# Patient Record
Sex: Female | Born: 1995 | Hispanic: Yes | Marital: Single | Smoking: Never smoker
Health system: Southern US, Community
[De-identification: ages and names within clinical notes are randomized; demographics above are authoritative.]

## PROBLEM LIST (undated history)

## (undated) HISTORY — PX: HERNIA REPAIR: SHX51

---
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
## 2017-11-30 ENCOUNTER — Emergency Department: Payer: 59 | Admitting: Anesthesiology

## 2017-11-30 ENCOUNTER — Emergency Department: Payer: 59

## 2017-11-30 ENCOUNTER — Other Ambulatory Visit: Payer: Self-pay

## 2017-11-30 ENCOUNTER — Ambulatory Visit
Admission: EM | Admit: 2017-11-30 | Discharge: 2017-12-01 | Disposition: A | Discharge status: Home / Self Care | Payer: 59 | Attending: Obstetrics and Gynecology | Admitting: Obstetrics and Gynecology

## 2017-11-30 ENCOUNTER — Encounter
Admission: EM | Disposition: A | Discharge status: Home / Self Care | Payer: Self-pay | Source: Home / Self Care | Attending: Emergency Medicine

## 2017-11-30 DIAGNOSIS — D271 Benign neoplasm of left ovary: Secondary | ICD-10-CM | POA: Insufficient documentation

## 2017-11-30 DIAGNOSIS — R102 Pelvic and perineal pain: Secondary | ICD-10-CM | POA: Diagnosis present

## 2017-11-30 DIAGNOSIS — N83202 Unspecified ovarian cyst, left side: Secondary | ICD-10-CM | POA: Diagnosis present

## 2017-11-30 DIAGNOSIS — N838 Other noninflammatory disorders of ovary, fallopian tube and broad ligament: Secondary | ICD-10-CM

## 2017-11-30 HISTORY — PX: LAPAROSCOPY: SHX197

## 2017-11-30 LAB — URINALYSIS, COMPLETE (UACMP) WITH MICROSCOPIC
BILIRUBIN URINE: NEGATIVE
Glucose, UA: NEGATIVE mg/dL
Hgb urine dipstick: NEGATIVE
Ketones, ur: 5 mg/dL — AB
Nitrite: NEGATIVE
PROTEIN: NEGATIVE mg/dL
SPECIFIC GRAVITY, URINE: 1.018 (ref 1.005–1.030)
pH: 7 (ref 5.0–8.0)

## 2017-11-30 LAB — COMPREHENSIVE METABOLIC PANEL
ALBUMIN: 4.2 g/dL (ref 3.5–5.0)
ALT: 7 U/L (ref 0–44)
AST: 14 U/L — AB (ref 15–41)
Alkaline Phosphatase: 38 U/L (ref 38–126)
Anion gap: 8 (ref 5–15)
BUN: 11 mg/dL (ref 6–20)
CHLORIDE: 106 mmol/L (ref 98–111)
CO2: 22 mmol/L (ref 22–32)
CREATININE: 0.55 mg/dL (ref 0.44–1.00)
Calcium: 9 mg/dL (ref 8.9–10.3)
GFR calc Af Amer: >60 mL/min (ref >60)
GLUCOSE: 113 mg/dL — AB (ref 70–99)
Potassium: 3.6 mmol/L (ref 3.5–5.1)
SODIUM: 136 mmol/L (ref 135–145)
Total Bilirubin: 0.7 mg/dL (ref 0.3–1.2)
Total Protein: 7.7 g/dL (ref 6.5–8.1)

## 2017-11-30 LAB — WET PREP, GENITAL
Clue Cells Wet Prep HPF POC: NONE SEEN
SPERM: NONE SEEN
Trich, Wet Prep: NONE SEEN
YEAST WET PREP: NONE SEEN

## 2017-11-30 LAB — CBC
HEMATOCRIT: 33.6 % — AB (ref 36.0–46.0)
Hemoglobin: 10.8 g/dL — ABNORMAL LOW (ref 12.0–15.0)
MCH: 28.1 pg (ref 26.0–34.0)
MCHC: 32.1 g/dL (ref 30.0–36.0)
MCV: 87.3 fL (ref 80.0–100.0)
Platelets: 408 10*3/uL — ABNORMAL HIGH (ref 150–400)
RBC: 3.85 MIL/uL — ABNORMAL LOW (ref 3.87–5.11)
RDW: 13 % (ref 11.5–15.5)
WBC: 13 10*3/uL — ABNORMAL HIGH (ref 4.0–10.5)
nRBC: 0 % (ref 0.0–0.2)

## 2017-11-30 LAB — LIPASE, BLOOD: LIPASE: 27 U/L (ref 11–51)

## 2017-11-30 LAB — CHLAMYDIA/NGC RT PCR (ARMC ONLY)
Chlamydia Tr: NOT DETECTED
N GONORRHOEAE: NOT DETECTED

## 2017-11-30 SURGERY — LAPAROSCOPY, DIAGNOSTIC
Anesthesia: General | Site: Abdomen | Laterality: Right

## 2017-11-30 MED ORDER — MIDAZOLAM HCL 2 MG/2ML IJ SOLN
INTRAMUSCULAR | Status: DC | PRN
  Administered 2017-11-30: 2 mg via INTRAVENOUS

## 2017-11-30 MED ORDER — OXYCODONE-ACETAMINOPHEN 5-325 MG PO TABS
1.0000 | ORAL_TABLET | ORAL | Status: AC | PRN
  Administered 2017-11-30 (×2): 1 via ORAL
  Filled 2017-11-30 (×2): qty 1

## 2017-11-30 MED ORDER — FENTANYL CITRATE (PF) 100 MCG/2ML IJ SOLN
INTRAMUSCULAR | Status: AC
  Filled 2017-11-30: qty 2

## 2017-11-30 MED ORDER — ROCURONIUM BROMIDE 50 MG/5ML IV SOLN
INTRAVENOUS | Status: AC
  Filled 2017-11-30: qty 1

## 2017-11-30 MED ORDER — HYDROMORPHONE HCL 1 MG/ML IJ SOLN
0.5000 mg | Freq: Once | INTRAMUSCULAR | Status: AC
  Administered 2017-11-30: 0.5 mg via INTRAMUSCULAR
  Filled 2017-11-30: qty 1

## 2017-11-30 MED ORDER — SUCCINYLCHOLINE CHLORIDE 20 MG/ML IJ SOLN
INTRAMUSCULAR | Status: DC | PRN
  Administered 2017-11-30: 80 mg via INTRAVENOUS

## 2017-11-30 MED ORDER — ROCURONIUM BROMIDE 100 MG/10ML IV SOLN
INTRAVENOUS | Status: DC | PRN
  Administered 2017-11-30: 30 mg via INTRAVENOUS
  Administered 2017-12-01: 10 mg via INTRAVENOUS

## 2017-11-30 MED ORDER — MIDAZOLAM HCL 2 MG/2ML IJ SOLN
INTRAMUSCULAR | Status: AC
  Filled 2017-11-30: qty 2

## 2017-11-30 MED ORDER — HYDROMORPHONE HCL 1 MG/ML IJ SOLN
0.5000 mg | Freq: Once | INTRAMUSCULAR | Status: AC
  Administered 2017-11-30: 0.5 mg via INTRAVENOUS
  Filled 2017-11-30: qty 1

## 2017-11-30 MED ORDER — PROPOFOL 10 MG/ML IV BOLUS
INTRAVENOUS | Status: DC | PRN
  Administered 2017-11-30: 150 mg via INTRAVENOUS

## 2017-11-30 MED ORDER — BUPIVACAINE HCL (PF) 0.5 % IJ SOLN
INTRAMUSCULAR | Status: AC
  Filled 2017-11-30: qty 30

## 2017-11-30 MED ORDER — ACETAMINOPHEN 10 MG/ML IV SOLN
INTRAVENOUS | Status: AC
  Filled 2017-11-30: qty 100

## 2017-11-30 MED ORDER — FENTANYL CITRATE (PF) 100 MCG/2ML IJ SOLN
INTRAMUSCULAR | Status: DC | PRN
  Administered 2017-11-30 – 2017-12-01 (×4): 50 ug via INTRAVENOUS

## 2017-11-30 MED ORDER — LACTATED RINGERS IV SOLN: INTRAVENOUS | Status: DC

## 2017-11-30 MED ORDER — LIDOCAINE HCL (CARDIAC) PF 100 MG/5ML IV SOSY
PREFILLED_SYRINGE | INTRAVENOUS | Status: DC | PRN
  Administered 2017-11-30: 60 mg via INTRAVENOUS

## 2017-11-30 MED ORDER — LACTATED RINGERS IV SOLN
INTRAVENOUS | Status: DC | PRN
  Administered 2017-11-30: 23:00:00 via INTRAVENOUS

## 2017-11-30 MED ORDER — PROPOFOL 10 MG/ML IV BOLUS
INTRAVENOUS | Status: AC
  Filled 2017-11-30: qty 20

## 2017-11-30 MED ORDER — DEXAMETHASONE SODIUM PHOSPHATE 10 MG/ML IJ SOLN
INTRAMUSCULAR | Status: DC | PRN
  Administered 2017-11-30: 10 mg via INTRAVENOUS

## 2017-11-30 SURGICAL SUPPLY — 39 items
ANCHOR TIS RET SYS 235ML (MISCELLANEOUS) ×3 IMPLANT
BAG URINE DRAINAGE (UROLOGICAL SUPPLIES) ×3 IMPLANT
BLADE SURG SZ11 CARB STEEL (BLADE) ×3 IMPLANT
CATH FOLEY 2WAY  5CC 16FR (CATHETERS) ×2
CATH URTH 16FR FL 2W BLN LF (CATHETERS) ×1 IMPLANT
CHLORAPREP W/TINT 26ML (MISCELLANEOUS) ×3 IMPLANT
CLOSURE WOUND 1/4X4 (GAUZE/BANDAGES/DRESSINGS) ×1
CORD MONOPOLAR M/FML 12FT (MISCELLANEOUS) ×3 IMPLANT
COVER WAND RF STERILE (DRAPES) IMPLANT
DERMABOND ADVANCED (GAUZE/BANDAGES/DRESSINGS) ×2
DERMABOND ADVANCED .7 DNX12 (GAUZE/BANDAGES/DRESSINGS) ×1 IMPLANT
GLOVE BIO SURGEON STRL SZ7 (GLOVE) ×9 IMPLANT
GLOVE INDICATOR 7.5 STRL GRN (GLOVE) ×6 IMPLANT
GOWN STRL REUS W/ TWL LRG LVL3 (GOWN DISPOSABLE) ×2 IMPLANT
GOWN STRL REUS W/TWL LRG LVL3 (GOWN DISPOSABLE) ×4
IRRIGATION STRYKERFLOW (MISCELLANEOUS) ×1 IMPLANT
IRRIGATOR STRYKERFLOW (MISCELLANEOUS) ×3
IV NS 1000ML (IV SOLUTION) ×2
IV NS 1000ML BAXH (IV SOLUTION) ×1 IMPLANT
KIT PINK PAD W/HEAD ARE REST (MISCELLANEOUS) ×3
KIT PINK PAD W/HEAD ARM REST (MISCELLANEOUS) ×1 IMPLANT
KIT TURNOVER CYSTO (KITS) ×3 IMPLANT
LABEL OR SOLS (LABEL) ×3 IMPLANT
LIGASURE LAP MARYLAND 5MM 37CM (ELECTROSURGICAL) ×3 IMPLANT
NS IRRIG 500ML POUR BTL (IV SOLUTION) ×3 IMPLANT
PACK GYN LAPAROSCOPIC (MISCELLANEOUS) ×3 IMPLANT
PAD OB MATERNITY 4.3X12.25 (PERSONAL CARE ITEMS) ×3 IMPLANT
PAD PREP 24X41 OB/GYN DISP (PERSONAL CARE ITEMS) ×3 IMPLANT
POUCH SPECIMEN RETRIEVAL 10MM (ENDOMECHANICALS) IMPLANT
SCISSORS METZENBAUM CVD 33 (INSTRUMENTS) IMPLANT
SLEEVE ENDOPATH XCEL 5M (ENDOMECHANICALS) ×3 IMPLANT
STRIP CLOSURE SKIN 1/4X4 (GAUZE/BANDAGES/DRESSINGS) ×2 IMPLANT
SUT MNCRL AB 4-0 PS2 18 (SUTURE) ×3 IMPLANT
SUT VIC AB 2-0 UR6 27 (SUTURE) ×3 IMPLANT
SUT VIC AB 4-0 SH 27 (SUTURE) ×2
SUT VIC AB 4-0 SH 27XANBCTRL (SUTURE) ×1 IMPLANT
TROCAR ENDO BLADELESS 11MM (ENDOMECHANICALS) ×3 IMPLANT
TROCAR XCEL NON-BLD 5MMX100MML (ENDOMECHANICALS) ×3 IMPLANT
TUBING INSUFFLATION (TUBING) ×3 IMPLANT

## 2017-11-30 NOTE — H&P (Signed)
Consult History and Physical   SERVICE: Gynecology   Patient Name: Theresa Christian Patient MRN:   [id number]  CC: RLQ pain  HPI: Theresa Christian is a 22 y.o. G0 with severe RLQ pain, started at noon today, wraps to lower back. CT found 6cm dermoid cyst on LEFT ovary. Similar sx and presentation 8 months ago.  On OCPs. Periods heavy, no dysmenorrhea, regular. No fhx of gyn cancers.  Meds: OCPs, Prozac Surgical hx: bilateral inguinal hernia repair at age 76     Review of Systems: positives in bold GEN:   fevers, chills, weight changes, appetite changes, fatigue, night sweats HEENT:  HA, vision changes, hearing loss, congestion, rhinorrhea, sinus pressure, dysphagia CV:   CP, palpitations PULM:  SOB, cough GI:  abd pain, N/V/D/C GU:  dysuria, urgency, frequency MSK:  arthralgias, myalgias, back pain, swelling SKIN:  rashes, color changes, pallor NEURO:  numbness, weakness, tingling, seizures, dizziness, tremors PSYCH:  depression, anxiety, behavioral problems, confusion  HEME/LYMPH:  easy bruising or bleeding ENDO:  heat/cold intolerance  Past Obstetrical History: OB History   None     Past Gynecologic History: Patient's last menstrual period was 11/22/2017 (approximate). M Past Medical History: History reviewed. No pertinent past medical history.  Past Surgical History:   Past Surgical History:  Procedure Laterality Date  . HERNIA REPAIR      Family History:  family history is not on file.  Social History:  Social History   Socioeconomic History  . Marital status: Single    Spouse name: Not on file  . Number of children: Not on file  . Years of education: Not on file  . Highest education level: Not on file  Occupational History  . Not on file  Social Needs  . Financial resource strain: Not on file  . Food insecurity:    Worry: Not on file    Inability: Not on file  . Transportation needs:    Medical: Not on file    Non-medical: Not on file  Tobacco  Use  . Smoking status: Never Smoker  . Smokeless tobacco: Never Used  Substance and Sexual Activity  . Alcohol use: Yes  . Drug use: Not Currently  . Sexual activity: Not on file  Lifestyle  . Physical activity:    Days per week: Not on file    Minutes per session: Not on file  . Stress: Not on file  Relationships  . Social connections:    Talks on phone: Not on file    Gets together: Not on file    Attends religious service: Not on file    Active member of club or organization: Not on file    Attends meetings of clubs or organizations: Not on file    Relationship status: Not on file  . Intimate partner violence:    Fear of current or ex partner: Not on file    Emotionally abused: Not on file    Physically abused: Not on file    Forced sexual activity: Not on file  Other Topics Concern  . Not on file  Social History Narrative  . Not on file    Home Medications:  Medications reconciled in EPIC  No current facility-administered medications on file prior to encounter.    Current Outpatient Medications on File Prior to Encounter  Medication Sig Dispense Refill  . FLUoxetine (PROZAC) 20 MG capsule Take 20 mg by mouth daily.    Marland Kitchen loratadine (CLARITIN) 10 MG tablet Take 10 mg by mouth  at bedtime.    . norethindrone-ethinyl estradiol-iron (MICROGESTIN FE,GILDESS FE,LOESTRIN FE) 1.5-30 MG-MCG tablet Take 1 tablet by mouth at bedtime.      Allergies:  No Known Allergies  Physical Exam:  Temp:  [98.3 F (36.8 C)] 98.3 F (36.8 C) (11/08 1428) Pulse Rate:  [89-91] 89 (11/08 1936) Resp:  [17-18] 18 (11/08 1936) BP: (136-139)/(86-90) 139/90 (11/08 1936) SpO2:  [100 %] 100 % (11/08 1936) Weight:  [77.1 kg] 77.1 kg (11/08 1429)   General Appearance:  Well developed, well nourished, no acute distress, alert and oriented x3 HEENT:  Normocephalic atraumatic, extraocular movements intact, moist mucous membranes Cardiovascular:  Normal S1/S2, regular rate and rhythm, no  murmurs Pulmonary:  clear to auscultation, no wheezes, rales or rhonchi, symmetric air entry, good air exchange Abdomen:  Bowel sounds present, soft, diffusely tender, nondistended, no abnormal masses, +guarding, limited exam Extremities:  Full range of motion, no pedal edema, 2+ distal pulses, no tenderness Skin:  normal coloration and turgor, no rashes, no suspicious skin lesions noted  Neurologic:  Cranial nerves 2-12 grossly intact, Psychiatric:  Normal mood and affect, appropriate, no AH/VH Pelvic:  deferred   Labs/Studies:   CBC and Coags:  Lab Results  Component Value Date   WBC 13.0 (H) 11/30/2017   HGB 10.8 (L) 11/30/2017   HCT 33.6 (L) 11/30/2017   MCV 87.3 11/30/2017   PLT 408 (H) 11/30/2017   CMP:  Lab Results  Component Value Date   NA 136 11/30/2017   K 3.6 11/30/2017   CL 106 11/30/2017   CO2 22 11/30/2017   BUN 11 11/30/2017   CREATININE 0.55 11/30/2017   CREATININE 0.78 03/15/2016   PROT 7.7 11/30/2017   BILITOT 0.7 11/30/2017   ALT 7 11/30/2017   AST 14 (L) 11/30/2017   ALKPHOS 38 11/30/2017    Other Imaging: US Pelvis Transvanginal Non-ob (tv Only)  Result Date: 11/30/2017 CLINICAL DATA:  22 y/o  F; 2 hours of pelvic pain. EXAM: TRANSABDOMINAL AND TRANSVAGINAL ULTRASOUND OF PELVIS DOPPLER ULTRASOUND OF OVARIES TECHNIQUE: Both transabdominal and transvaginal ultrasound examinations of the pelvis were performed. Transabdominal technique was performed for global imaging of the pelvis including uterus, ovaries, adnexal regions, and pelvic cul-de-sac. It was necessary to proceed with endovaginal exam following the transabdominal exam to visualize the adnexa. Color and duplex Doppler ultrasound was utilized to evaluate blood flow to the ovaries. COMPARISON:  11/30/2017 CT abdomen and pelvis. 03/15/2016 pelvic ultrasound. FINDINGS: Uterus Measurements: 7.6 x 3.2 x 3.8 cm = volume: 48 mL. No fibroids or other mass visualized. Endometrium Thickness: 11 mm.  No focal  abnormality visualized. Right ovary Not visualized. Left ovary Measurements: 3.2 x 1.9 x 3.2 cm = volume: 9.9 mL. Normal appearance of the ovary with simple follicles. Adjacent to the ovary in the left adnexa is a complex mass measuring 5.7 x 4.5 x 6.0 cm with both cystic and solid components. On CT the solid components demonstrate calcium and fat attenuation. Pulsed Doppler evaluation of both ovaries demonstrates normal low-resistance arterial and venous waveforms. Other findings No abnormal free fluid. IMPRESSION: 1. No acute process identified. 2. Left adnexal 6 cm mass with features on CT and ultrasound consistent with teratoma. Electronically Signed   By: Kristine Garbe M.D.   On: 11/30/2017 17:45   US Pelvis Complete  Result Date: 11/30/2017 CLINICAL DATA:  21 y/o  F; 2 hours of pelvic pain. EXAM: TRANSABDOMINAL AND TRANSVAGINAL ULTRASOUND OF PELVIS DOPPLER ULTRASOUND OF OVARIES TECHNIQUE: Both transabdominal and transvaginal  ultrasound examinations of the pelvis were performed. Transabdominal technique was performed for global imaging of the pelvis including uterus, ovaries, adnexal regions, and pelvic cul-de-sac. It was necessary to proceed with endovaginal exam following the transabdominal exam to visualize the adnexa. Color and duplex Doppler ultrasound was utilized to evaluate blood flow to the ovaries. COMPARISON:  11/30/2017 CT abdomen and pelvis. 03/15/2016 pelvic ultrasound. FINDINGS: Uterus Measurements: 7.6 x 3.2 x 3.8 cm = volume: 48 mL. No fibroids or other mass visualized. Endometrium Thickness: 11 mm.  No focal abnormality visualized. Right ovary Not visualized. Left ovary Measurements: 3.2 x 1.9 x 3.2 cm = volume: 9.9 mL. Normal appearance of the ovary with simple follicles. Adjacent to the ovary in the left adnexa is a complex mass measuring 5.7 x 4.5 x 6.0 cm with both cystic and solid components. On CT the solid components demonstrate calcium and fat attenuation. Pulsed Doppler  evaluation of both ovaries demonstrates normal low-resistance arterial and venous waveforms. Other findings No abnormal free fluid. IMPRESSION: 1. No acute process identified. 2. Left adnexal 6 cm mass with features on CT and ultrasound consistent with teratoma. Electronically Signed   By: Kristine Garbe M.D.   On: 11/30/2017 17:45   US Pelvic Doppler (torsion R/o Or Mass Arterial Flow)  Result Date: 11/30/2017 CLINICAL DATA:  22 y/o  F; 2 hours of pelvic pain. EXAM: TRANSABDOMINAL AND TRANSVAGINAL ULTRASOUND OF PELVIS DOPPLER ULTRASOUND OF OVARIES TECHNIQUE: Both transabdominal and transvaginal ultrasound examinations of the pelvis were performed. Transabdominal technique was performed for global imaging of the pelvis including uterus, ovaries, adnexal regions, and pelvic cul-de-sac. It was necessary to proceed with endovaginal exam following the transabdominal exam to visualize the adnexa. Color and duplex Doppler ultrasound was utilized to evaluate blood flow to the ovaries. COMPARISON:  11/30/2017 CT abdomen and pelvis. 03/15/2016 pelvic ultrasound. FINDINGS: Uterus Measurements: 7.6 x 3.2 x 3.8 cm = volume: 48 mL. No fibroids or other mass visualized. Endometrium Thickness: 11 mm.  No focal abnormality visualized. Right ovary Not visualized. Left ovary Measurements: 3.2 x 1.9 x 3.2 cm = volume: 9.9 mL. Normal appearance of the ovary with simple follicles. Adjacent to the ovary in the left adnexa is a complex mass measuring 5.7 x 4.5 x 6.0 cm with both cystic and solid components. On CT the solid components demonstrate calcium and fat attenuation. Pulsed Doppler evaluation of both ovaries demonstrates normal low-resistance arterial and venous waveforms. Other findings No abnormal free fluid. IMPRESSION: 1. No acute process identified. 2. Left adnexal 6 cm mass with features on CT and ultrasound consistent with teratoma. Electronically Signed   By: Kristine Garbe M.D.   On: 11/30/2017  17:45   Ct Renal Stone Study  Result Date: 11/30/2017 CLINICAL DATA:  Acute onset right lower quadrant pain 2-3 hours ago. EXAM: CT ABDOMEN AND PELVIS WITHOUT CONTRAST TECHNIQUE: Multidetector CT imaging of the abdomen and pelvis was performed following the standard protocol without IV contrast. COMPARISON:  CT abdomen and pelvis 03/15/2016. Pelvic ultrasound 03/15/2016. FINDINGS: Lower chest: Lung bases are clear. No pleural or pericardial effusion. Hepatobiliary: No focal liver abnormality is seen. No gallstones, gallbladder wall thickening, or biliary dilatation. Pancreas: Unremarkable. No pancreatic ductal dilatation or surrounding inflammatory changes. Spleen: Normal in size without focal abnormality. Adrenals/Urinary Tract: Adrenal glands are unremarkable. Kidneys are normal, without renal calculi, focal lesion, or hydronephrosis. Bladder is unremarkable. Stomach/Bowel: Stomach is within normal limits. Appendix appears normal. No evidence of bowel wall thickening, distention, or inflammatory changes. Vascular/Lymphatic: No  significant vascular findings are present. No enlarged abdominal or pelvic lymph nodes. Reproductive: A left ovarian lesion measuring 5.9 cm transverse by 4.1 cm AP by 4.6 cm craniocaudal is predominantly fluid attenuating but does have calcium and fat within it consistent with a teratoma. The lesion was present on the prior examination but the fat and calcium components within are much more conspicuous on today's study. The lesion is not grossly changed in size. Other: None. Musculoskeletal: No acute or focal abnormality. IMPRESSION: Negative for appendicitis.  No acute abnormality. Left ovarian teratoma. Electronically Signed   By: Inge Rise M.D.   On: 11/30/2017 16:18     Assessment / Plan:   Theresa Christian is a 22 y.o. with concern for ovarian torsion with a large dermoid cyst on LEFT ovary.  Plan for dx laparoscopy, left ovarian cystectomy, possible left oophorectomy,  possible laparotomy  Risks of surgery including bleeding which may require transfusion or reoperation, infection, injury to bowel or other surrounding organs, need for additional procedures including (laparoscopy or) laparotomy were explained to patient and written informed consent was obtained.  Patient has been NPO since this morning and she will remain NPO for procedure. Anesthesia and OR aware.  Preoperative SCDs ordered on call to the OR.  To OR when ready.       Thank you for the opportunity to be involved with this pt's care.

## 2017-11-30 NOTE — ED Notes (Signed)
Pelvic exam completed by Dr. Jimmye Norman.  I was present during exam.  Specimens collected and sent to lab.

## 2017-11-30 NOTE — Anesthesia Preprocedure Evaluation (Addendum)
Anesthesia Evaluation  Patient identified by MRN, date of birth, ID band Patient awake    Reviewed: Allergy & Precautions, H&P , NPO status , Patient's Chart, lab work & pertinent test results  Airway Mallampati: II  TM Distance: >3 FB Neck ROM: full    Dental   Pulmonary neg pulmonary ROS,           Cardiovascular negative cardio ROS       Neuro/Psych negative neurological ROS  negative psych ROS   GI/Hepatic negative GI ROS, Neg liver ROS,   Endo/Other  negative endocrine ROS  Renal/GU      Musculoskeletal   Abdominal   Peds  Hematology negative hematology ROS (+)   Anesthesia Other Findings History reviewed. No pertinent past medical history.  Past Surgical History: No date: HERNIA REPAIR  BMI    Body Mass Index:  29.18 kg/m      Reproductive/Obstetrics negative OB ROS                            Anesthesia Physical Anesthesia Plan  ASA: I and emergent  Anesthesia Plan: General ETT   Post-op Pain Management:    Induction:   PONV Risk Score and Plan: Ondansetron and Dexamethasone  Airway Management Planned:   Additional Equipment:   Intra-op Plan:   Post-operative Plan:   Informed Consent: I have reviewed the patients History and Physical, chart, labs and discussed the procedure including the risks, benefits and alternatives for the proposed anesthesia with the patient or authorized representative who has indicated his/her understanding and acceptance.   Dental Advisory Given  Plan Discussed with: Anesthesiologist, CRNA and Surgeon  Anesthesia Plan Comments:        Anesthesia Quick Evaluation

## 2017-11-30 NOTE — ED Notes (Signed)
Labs and urine sent at this time. POC preg neg

## 2017-11-30 NOTE — ED Notes (Signed)
Pt inquired about wait time. Informed her that I could tell her how many people were ahead of her but could not give her a weight time. Pt informed that she should be one of the next people to be called back to C pod.

## 2017-11-30 NOTE — ED Notes (Signed)
Report given to OR nurse

## 2017-11-30 NOTE — ED Provider Notes (Signed)
[facility] Emergency Department Provider Note       Time seen: ----------------------------------------- 4:16 PM on 11/30/2017 -----------------------------------------   I have reviewed the triage vital signs and the nursing notes.  HISTORY   Chief Complaint Abdominal Pain    HPI Theresa Christian is a 22 y.o. female with no significant past medical history who presents to the ED for lower quadrant pain.  Patient describes sudden onset right lower quadrant pain that started 2 hours prior to arrival with nausea.  She denies any vomiting or diarrhea.  She denies any history of kidney stones.  She denies fevers, chills or other complaints.  History reviewed. No pertinent past medical history.  There are no active problems to display for this patient.   Past Surgical History:  Procedure Laterality Date  . HERNIA REPAIR      Allergies Patient has no known allergies.  Social History Social History   Tobacco Use  . Smoking status: Never Smoker  . Smokeless tobacco: Never Used  Substance Use Topics  . Alcohol use: Yes  . Drug use: Not Currently   Review of Systems Constitutional: Negative for fever. Cardiovascular: Negative for chest pain. Respiratory: Negative for shortness of breath. Gastrointestinal: Positive for right lower quadrant pain Musculoskeletal: Negative for back pain. Skin: Negative for rash. Neurological: Negative for headaches, focal weakness or numbness.  All systems negative/normal/unremarkable except as stated in the HPI  ____________________________________________   PHYSICAL EXAM:  VITAL SIGNS: ED Triage Vitals  Enc Vitals Group     BP 11/30/17 1428 136/86     Pulse Rate 11/30/17 1428 91     Resp 11/30/17 1428 17     Temp 11/30/17 1428 98.3 F (36.8 C)     Temp Source 11/30/17 1428 Oral     SpO2 11/30/17 1428 100 %     Weight 11/30/17 1429 170 lb (77.1 kg)     Height 11/30/17 1429 5\' 4"  (1.626 m)     Head  Circumference --      Peak Flow --      Pain Score 11/30/17 1429 8     Pain Loc --      Pain Edu? --      Excl. in [place]? --    Constitutional: Alert and oriented. Well appearing and in no distress. ENT   Head: Normocephalic and atraumatic.   Nose: No congestion/rhinnorhea.   Mouth/Throat: Mucous membranes are moist.   Neck: No stridor. Cardiovascular: Normal rate, regular rhythm. No murmurs, rubs, or gallops. Respiratory: Normal respiratory effort without tachypnea nor retractions. Breath sounds are clear and equal bilaterally. No wheezes/rales/rhonchi. Gastrointestinal: Right lower quadrant tenderness, no rebound or guarding.  Normal bowel sounds. Genitourinary: Mild cervical motion tenderness, discharge is noted with light bleeding at the office Musculoskeletal: Nontender with normal range of motion in extremities. No lower extremity tenderness nor edema. Neurologic:  Normal speech and language. No gross focal neurologic deficits are appreciated.  Skin:  Skin is warm, dry and intact. No rash noted. Psychiatric: Mood and affect are normal. Speech and behavior are normal.  ___________________________________________  ED COURSE:  As part of my medical decision making, I reviewed the following data within the [place] History obtained from family if available, nursing notes, old chart and ekg, as well as notes from prior ED visits. Patient presented for right lower quadrant pain, we will assess with labs and imaging as indicated at this time.   Procedures ____________________________________________   LABS (pertinent positives/negatives)  Labs Reviewed  WET PREP, GENITAL - Abnormal; Notable for the following components:      Result Value   WBC, Wet Prep HPF POC RARE (*)    All other components within normal limits  COMPREHENSIVE METABOLIC PANEL - Abnormal; Notable for the following components:   Glucose, Bld 113 (*)    AST 14 (*)    All other  components within normal limits  CBC - Abnormal; Notable for the following components:   WBC 13.0 (*)    RBC 3.85 (*)    Hemoglobin 10.8 (*)    HCT 33.6 (*)    Platelets 408 (*)    All other components within normal limits  URINALYSIS, COMPLETE (UACMP) WITH MICROSCOPIC - Abnormal; Notable for the following components:   Color, Urine YELLOW (*)    APPearance HAZY (*)    Ketones, ur 5 (*)    Leukocytes, UA TRACE (*)    Bacteria, UA RARE (*)    All other components within normal limits  CHLAMYDIA/NGC RT PCR ([facility] ONLY)  LIPASE, BLOOD  POC URINE PREG, ED    RADIOLOGY Images were viewed by me  CT renal protocol IMPRESSION: Negative for appendicitis.  No acute abnormality.  Left ovarian teratoma. IMPRESSION: 1. No acute process identified. 2. Left adnexal 6 cm mass with features on CT and ultrasound consistent with teratoma.   ____________________________________________  DIFFERENTIAL DIAGNOSIS   Renal colic, UTI, pyelonephritis, appendicitis, ovarian cyst, ovarian torsion, PID  FINAL ASSESSMENT AND PLAN  Abdominal pain, left ovarian mass   Plan: The patient had presented for right lower quadrant abdominal pain. Patient's labs are grossly unremarkable although she did have mild leukocytosis. 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  Patient has had persistent significant pain while in the ER.  She has required several doses of IV narcotics for pain control.  Gust with GYN on-call Dr. Leafy Ro who will come evaluate her and likely take her to the operating room for possible ovarian torsion.   Laurence Aly, MD   Note: This note was generated in part or whole with voice recognition software. Voice recognition is usually quite accurate but there are transcription errors that can and very often do occur. I apologize for any typographical errors that were not detected and corrected.     Earleen Newport, MD 11/30/17 [phone]

## 2017-11-30 NOTE — ED Triage Notes (Signed)
Pt c/o RLQ pain that started suddenly about 2hrs PTA with nausea. Denies vomiting or diarrhea denies a hx of kidney stones.

## 2017-12-01 ENCOUNTER — Other Ambulatory Visit: Payer: Self-pay

## 2017-12-01 LAB — CBC
HCT: 33.7 % — ABNORMAL LOW (ref 36.0–46.0)
Hemoglobin: 10.5 g/dL — ABNORMAL LOW (ref 12.0–15.0)
MCH: 27.8 pg (ref 26.0–34.0)
MCHC: 31.2 g/dL (ref 30.0–36.0)
MCV: 89.2 fL (ref 80.0–100.0)
Platelets: 404 10*3/uL — ABNORMAL HIGH (ref 150–400)
RBC: 3.78 MIL/uL — AB (ref 3.87–5.11)
RDW: 12.9 % (ref 11.5–15.5)
WBC: 14.2 10*3/uL — ABNORMAL HIGH (ref 4.0–10.5)
nRBC: 0 % (ref 0.0–0.2)

## 2017-12-01 LAB — BASIC METABOLIC PANEL
Anion gap: 8 (ref 5–15)
BUN: 9 mg/dL (ref 6–20)
CHLORIDE: 106 mmol/L (ref 98–111)
CO2: 25 mmol/L (ref 22–32)
CREATININE: 0.57 mg/dL (ref 0.44–1.00)
Calcium: 8.9 mg/dL (ref 8.9–10.3)
GFR calc Af Amer: >60 mL/min (ref >60)
GFR calc non Af Amer: >60 mL/min (ref >60)
GLUCOSE: 138 mg/dL — AB (ref 70–99)
POTASSIUM: 4 mmol/L (ref 3.5–5.1)
Sodium: 139 mmol/L (ref 135–145)

## 2017-12-01 MED ORDER — DOCUSATE SODIUM 100 MG PO CAPS: 100.0000 mg | ORAL_CAPSULE | Freq: Two times a day (BID) | ORAL | 0 refills | Status: AC

## 2017-12-01 MED ORDER — FLUOXETINE HCL 10 MG PO CAPS
20.0000 mg | ORAL_CAPSULE | Freq: Every day | ORAL | Status: DC
  Administered 2017-12-01: 20 mg via ORAL
  Filled 2017-12-01: qty 2

## 2017-12-01 MED ORDER — KETOROLAC TROMETHAMINE 30 MG/ML IJ SOLN
INTRAMUSCULAR | Status: DC | PRN
  Administered 2017-12-01: 30 mg via INTRAVENOUS

## 2017-12-01 MED ORDER — LACTATED RINGERS IV SOLN
INTRAVENOUS | Status: DC
  Administered 2017-12-01: 03:00:00 via INTRAVENOUS

## 2017-12-01 MED ORDER — HYDROMORPHONE HCL 1 MG/ML IJ SOLN: 0.2000 mg | INTRAMUSCULAR | Status: DC | PRN

## 2017-12-01 MED ORDER — ONDANSETRON HCL 4 MG/2ML IJ SOLN: 4.0000 mg | Freq: Four times a day (QID) | INTRAMUSCULAR | Status: DC | PRN

## 2017-12-01 MED ORDER — GABAPENTIN 800 MG PO TABS: 800.0000 mg | ORAL_TABLET | Freq: Every day | ORAL | 0 refills | Status: AC

## 2017-12-01 MED ORDER — GABAPENTIN 300 MG PO CAPS: 900.0000 mg | ORAL_CAPSULE | Freq: Every day | ORAL | Status: DC

## 2017-12-01 MED ORDER — HYDROMORPHONE HCL 1 MG/ML IJ SOLN: 0.2500 mg | INTRAMUSCULAR | Status: DC | PRN

## 2017-12-01 MED ORDER — LORATADINE 10 MG PO TABS
10.0000 mg | ORAL_TABLET | Freq: Every day | ORAL | Status: DC
  Filled 2017-12-01: qty 1

## 2017-12-01 MED ORDER — BUPIVACAINE HCL 0.5 % IJ SOLN
INTRAMUSCULAR | Status: DC | PRN
  Administered 2017-12-01: 14 mL

## 2017-12-01 MED ORDER — ACETAMINOPHEN 500 MG PO TABS
1000.0000 mg | ORAL_TABLET | Freq: Four times a day (QID) | ORAL | Status: DC
  Administered 2017-12-01: 1000 mg via ORAL
  Filled 2017-12-01: qty 2

## 2017-12-01 MED ORDER — PROMETHAZINE HCL 25 MG/ML IJ SOLN: 6.2500 mg | INTRAMUSCULAR | Status: DC | PRN

## 2017-12-01 MED ORDER — SIMETHICONE 80 MG PO CHEW: 80.0000 mg | CHEWABLE_TABLET | Freq: Four times a day (QID) | ORAL | Status: DC | PRN

## 2017-12-01 MED ORDER — MENTHOL 3 MG MT LOZG
1.0000 | LOZENGE | OROMUCOSAL | Status: DC | PRN
  Filled 2017-12-01: qty 9

## 2017-12-01 MED ORDER — NORETHIN ACE-ETH ESTRAD-FE 1.5-30 MG-MCG PO TABS: 1.0000 | ORAL_TABLET | Freq: Every day | ORAL | Status: DC

## 2017-12-01 MED ORDER — KETOROLAC TROMETHAMINE 30 MG/ML IJ SOLN
30.0000 mg | Freq: Four times a day (QID) | INTRAMUSCULAR | Status: DC
  Administered 2017-12-01: 30 mg via INTRAVENOUS
  Filled 2017-12-01: qty 1

## 2017-12-01 MED ORDER — IBUPROFEN 800 MG PO TABS: 800.0000 mg | ORAL_TABLET | Freq: Three times a day (TID) | ORAL | 1 refills | Status: AC | PRN

## 2017-12-01 MED ORDER — ACETAMINOPHEN 500 MG PO TABS: 1000.0000 mg | ORAL_TABLET | Freq: Four times a day (QID) | ORAL | 0 refills | Status: AC

## 2017-12-01 MED ORDER — CELECOXIB 200 MG PO CAPS
200.0000 mg | ORAL_CAPSULE | Freq: Two times a day (BID) | ORAL | Status: DC
  Filled 2017-12-01 (×3): qty 1

## 2017-12-01 MED ORDER — ALUM & MAG HYDROXIDE-SIMETH 200-200-20 MG/5ML PO SUSP: 30.0000 mL | ORAL | Status: DC | PRN

## 2017-12-01 MED ORDER — SUGAMMADEX SODIUM 200 MG/2ML IV SOLN
INTRAVENOUS | Status: AC
  Filled 2017-12-01: qty 2

## 2017-12-01 MED ORDER — ONDANSETRON HCL 4 MG/2ML IJ SOLN
INTRAMUSCULAR | Status: DC | PRN
  Administered 2017-12-01: 4 mg via INTRAVENOUS

## 2017-12-01 MED ORDER — DOCUSATE SODIUM 100 MG PO CAPS
100.0000 mg | ORAL_CAPSULE | Freq: Two times a day (BID) | ORAL | Status: DC
  Administered 2017-12-01: 100 mg via ORAL
  Filled 2017-12-01: qty 1

## 2017-12-01 MED ORDER — ACETAMINOPHEN 10 MG/ML IV SOLN
INTRAVENOUS | Status: DC | PRN
  Administered 2017-12-01: 1000 mg via INTRAVENOUS

## 2017-12-01 MED ORDER — KETOROLAC TROMETHAMINE 30 MG/ML IJ SOLN: 30.0000 mg | Freq: Once | INTRAMUSCULAR | Status: DC

## 2017-12-01 MED ORDER — KETOROLAC TROMETHAMINE 30 MG/ML IJ SOLN: 30.0000 mg | Freq: Four times a day (QID) | INTRAMUSCULAR | Status: DC

## 2017-12-01 MED ORDER — OXYCODONE HCL 5 MG PO TABS: 5.0000 mg | ORAL_TABLET | ORAL | Status: DC | PRN

## 2017-12-01 MED ORDER — SUGAMMADEX SODIUM 200 MG/2ML IV SOLN
INTRAVENOUS | Status: DC | PRN
  Administered 2017-12-01: 150 mg via INTRAVENOUS

## 2017-12-01 MED ORDER — OXYCODONE HCL 5 MG PO CAPS: 5.0000 mg | ORAL_CAPSULE | Freq: Four times a day (QID) | ORAL | 0 refills | Status: AC | PRN

## 2017-12-01 MED ORDER — ONDANSETRON HCL 4 MG PO TABS: 4.0000 mg | ORAL_TABLET | Freq: Four times a day (QID) | ORAL | Status: DC | PRN

## 2017-12-01 NOTE — Discharge Summary (Signed)
1 Day Post-Op       Procedure(s): LAPAROSCOPY DIAGNOSTIC, RIGHT OVARIAN CYSTECTOMY (Right) Subjective: The patient is doing well.  No nausea or vomiting. Pain is adequately controlled.  Objective: Vital signs in last 24 hours: Temp:  [97.3 F (36.3 C)-98.6 F (37 C)] 98 F (36.7 C) (11/09 0826) Pulse Rate:  [89-123] 97 (11/09 0826) Resp:  [15-22] 18 (11/09 0826) BP: (119-139)/(63-90) 127/82 (11/09 0826) SpO2:  [97 %-100 %] 97 % (11/09 0826) Weight:  [77.1 kg] 77.1 kg (11/08 1429)  Intake/Output  Intake/Output Summary (Last 24 hours) at 12/01/2017 0856 Last data filed at 12/01/2017 0240 Gross per 24 hour  Intake 965.42 ml  Output 100 ml  Net 865.42 ml    Physical Exam:  General: Alert and oriented. CV: RRR Lungs: Clear bilaterally. GI: Soft, Nondistended. Incisions: Clean and dry. Urine: Clear, adequate Extremities: Nontender, no erythema, no edema.  Lab Results: Recent Labs    11/30/17 1443 12/01/17 0629  HGB 10.8* 10.5*  HCT 33.6* 33.7*  WBC 13.0* 14.2*  PLT 408* 404*                 Results for orders placed or performed during the hospital encounter of 11/30/17 (from the past 24 hour(s))  Lipase, blood     Status: None   Collection Time: 11/30/17  2:43 PM  Result Value Ref Range   Lipase 27 11 - 51 U/L  Comprehensive metabolic panel     Status: Abnormal   Collection Time: 11/30/17  2:43 PM  Result Value Ref Range   Sodium 136 135 - 145 mmol/L   Potassium 3.6 3.5 - 5.1 mmol/L   Chloride 106 98 - 111 mmol/L   CO2 22 22 - 32 mmol/L   Glucose, Bld 113 (H) 70 - 99 mg/dL   BUN 11 6 - 20 mg/dL   Creatinine, Ser 0.55 0.44 - 1.00 mg/dL   Calcium 9.0 8.9 - 10.3 mg/dL   Total Protein 7.7 6.5 - 8.1 g/dL   Albumin 4.2 3.5 - 5.0 g/dL   AST 14 (L) 15 - 41 U/L   ALT 7 0 - 44 U/L   Alkaline Phosphatase 38 38 - 126 U/L   Total Bilirubin 0.7 0.3 - 1.2 mg/dL   GFR calc non Af Amer >60 >60 mL/min   GFR calc Af Amer >60 >60 mL/min   Anion gap 8 5 - 15  CBC      Status: Abnormal   Collection Time: 11/30/17  2:43 PM  Result Value Ref Range   WBC 13.0 (H) 4.0 - 10.5 K/uL   RBC 3.85 (L) 3.87 - 5.11 MIL/uL   Hemoglobin 10.8 (L) 12.0 - 15.0 g/dL   HCT 33.6 (L) 36.0 - 46.0 %   MCV 87.3 80.0 - 100.0 fL   MCH 28.1 26.0 - 34.0 pg   MCHC 32.1 30.0 - 36.0 g/dL   RDW 13.0 11.5 - 15.5 %   Platelets 408 (H) 150 - 400 K/uL   nRBC 0.0 0.0 - 0.2 %  Urinalysis, Complete w Microscopic     Status: Abnormal   Collection Time: 11/30/17  2:43 PM  Result Value Ref Range   Color, Urine YELLOW (A) YELLOW   APPearance HAZY (A) CLEAR   Specific Gravity, Urine 1.018 1.005 - 1.030   pH 7.0 5.0 - 8.0   Glucose, UA NEGATIVE NEGATIVE mg/dL   Hgb urine dipstick NEGATIVE NEGATIVE   Bilirubin Urine NEGATIVE NEGATIVE   Ketones, ur 5 (A) NEGATIVE  mg/dL   Protein, ur NEGATIVE NEGATIVE mg/dL   Nitrite NEGATIVE NEGATIVE   Leukocytes, UA TRACE (A) NEGATIVE   RBC / HPF 6-10 0 - 5 RBC/hpf   WBC, UA 6-10 0 - 5 WBC/hpf   Bacteria, UA RARE (A) NONE SEEN   Squamous Epithelial / LPF 0-5 0 - 5   Mucus PRESENT   Wet prep, genital     Status: Abnormal   Collection Time: 11/30/17  6:35 PM  Result Value Ref Range   Yeast Wet Prep HPF POC NONE SEEN NONE SEEN   Trich, Wet Prep NONE SEEN NONE SEEN   Clue Cells Wet Prep HPF POC NONE SEEN NONE SEEN   WBC, Wet Prep HPF POC RARE (A) NONE SEEN   Sperm NONE SEEN   Chlamydia/NGC rt PCR ([facility] only)     Status: None   Collection Time: 11/30/17  6:35 PM  Result Value Ref Range   Specimen source GC/Chlam VAGINAL/RECTAL    Chlamydia Tr NOT DETECTED NOT DETECTED   N gonorrhoeae NOT DETECTED NOT DETECTED  CBC     Status: Abnormal   Collection Time: 12/01/17  6:29 AM  Result Value Ref Range   WBC 14.2 (H) 4.0 - 10.5 K/uL   RBC 3.78 (L) 3.87 - 5.11 MIL/uL   Hemoglobin 10.5 (L) 12.0 - 15.0 g/dL   HCT 33.7 (L) 36.0 - 46.0 %   MCV 89.2 80.0 - 100.0 fL   MCH 27.8 26.0 - 34.0 pg   MCHC 31.2 30.0 - 36.0 g/dL   RDW 12.9 11.5 - 15.5 %   Platelets  404 (H) 150 - 400 K/uL   nRBC 0.0 0.0 - 0.2 %  Basic metabolic panel     Status: Abnormal   Collection Time: 12/01/17  6:29 AM  Result Value Ref Range   Sodium 139 135 - 145 mmol/L   Potassium 4.0 3.5 - 5.1 mmol/L   Chloride 106 98 - 111 mmol/L   CO2 25 22 - 32 mmol/L   Glucose, Bld 138 (H) 70 - 99 mg/dL   BUN 9 6 - 20 mg/dL   Creatinine, Ser 0.57 0.44 - 1.00 mg/dL   Calcium 8.9 8.9 - 10.3 mg/dL   GFR calc non Af Amer >60 >60 mL/min   GFR calc Af Amer >60 >60 mL/min   Anion gap 8 5 - 15    Assessment/Plan: 1 Day Post-Op       Procedure(s): LAPAROSCOPY DIAGNOSTIC, RIGHT OVARIAN CYSTECTOMY (Right)  1) Ambulate, Incentive spirometry 2) Advance diet as tolerated 3) Transition to oral pain medication 5) Discharge home today anticipated    Benjaman Kindler, MD   LOS: 0 days   Benjaman Kindler 12/01/2017, 8:56 AM

## 2017-12-01 NOTE — Anesthesia Post-op Follow-up Note (Signed)
Anesthesia QCDR form completed.        

## 2017-12-01 NOTE — Transfer of Care (Signed)
Immediate Anesthesia Transfer of Care Note  Patient: Theresa Christian  Procedure(s) Performed: LAPAROSCOPY DIAGNOSTIC, RIGHT OVARIAN CYSTECTOMY (Right Abdomen)  Patient Location: PACU  Anesthesia Type:General  Level of Consciousness: awake and oriented  Airway & Oxygen Therapy: Patient Spontanous Breathing and Patient connected to face mask oxygen  Post-op Assessment: Report given to RN and Post -op Vital signs reviewed and stable  Post vital signs: Reviewed and stable  Last Vitals:  Vitals Value Taken Time  BP 127/63 12/01/2017 12:48 AM  Temp 36.3 C 12/01/2017 12:48 AM  Pulse 126 12/01/2017 12:49 AM  Resp 18 12/01/2017 12:49 AM  SpO2 100 % 12/01/2017 12:49 AM  Vitals shown include unvalidated device data.  Last Pain:  Vitals:   11/30/17 2138  TempSrc:   PainSc: 6          Complications: No apparent anesthesia complications

## 2017-12-01 NOTE — Progress Notes (Signed)
Discharge instructions complete and prescriptions given. Patient verbalizes understanding of teaching. Patient discharged home via wheelchair at 1020.

## 2017-12-01 NOTE — Op Note (Signed)
Theresa Christian PROCEDURE DATE: 12/01/2017  INDICATIONS: Acute pelvic pain  PREOPERATIVE DIAGNOSIS: Adnexal mass, suspected dermoid POSTOPERATIVE DIAGNOSIS: RIGHT ovarian and fallopian tube torsion; RIGHT ovarian dermoid cyst PROCEDURE: Exam under anesthesia, diagnostic laparoscopy, RIGHT ovarian cystectomy SURGEON:  Dr. Benjaman Kindler ASSISTANT: CST ANESTHESIOLOGIST: Durenda Hurt, MD Anesthesiologist: Durenda Hurt, MD CRNA: Allean Found, CRNA  INDICATIONS: 22 y.o. G0 with history of acute-onset pelvic pain desiring surgical evaluation, with a 6cm dermoid cyst noted on CT scan, thought to be in the left adnexa.   Please see preoperative notes for further details.  Risks of surgery were discussed with the patient including but not limited to: bleeding which may require transfusion or reoperation; infection which may require antibiotics; injury to bowel, bladder, ureters or other surrounding organs; need for additional procedures including laparotomy; thromboembolic phenomenon, incisional problems and other postoperative/anesthesia complications. Written informed consent was obtained.    FINDINGS:  Small uterus, normal left ovary and fallopian tubes bilaterally. Ovarian cyst on right side, with hair and fat, torsed 4 times around the ovarian IP ligament. Blue and black on entry, and pinking up with bright red bleeding by the close of the case. No other abdominal/pelvic abnormality.  Normal upper abdomen. No Hemoperitoneum  ANESTHESIA:    General INTRAVENOUS FLUIDS: 800 ml ESTIMATED BLOOD LOSS: 50 ml HEMOPERITONEUM: 62ml URINE OUTPUT: 50 ml SPECIMENS: Ovarian cyst, right COMPLICATIONS: None immediate  PROCEDURE IN DETAIL:  The patient had sequential compression devices applied to her lower extremities while in the preoperative area.  She was then taken to the operating room where general anesthesia was administered and was found to be adequate.  She was placed in the dorsal  lithotomy position, and was prepped and draped in a sterile manner.  A Foley catheter was inserted into her bladder and attached to constant drainage and a uterine manipulator was then advanced into the uterus .  After an adequate timeout was performed, attention was turned to the abdomen where an umbilical incision was made with the scalpel.  The Optiview 5-mm trocar and sleeve were then advanced without difficulty with the laparoscope under direct visualization into the abdomen.  The abdomen was then insufflated with carbon dioxide gas and adequate pneumoperitoneum was obtained.   A detailed survey of the patient's pelvis and abdomen revealed the findings as mentioned above. Two additional trocars were placed in the bilateral lower quadrants under direct visualization, a 64mm on the left.   The right ovary and tube were detorsed and the tube began to pink up promptly.  A lap bag was placed under the right ovary. The ovarian cyst was evaluated and dissected out from the normal ovarian tissue using cautery, hydrodissection and blunt pulling. Hemostasis was assured with electrocautery. The pelvis was irrigated and all fluid and blood removed. The cyst did not rupture in the belly, was contained entirely within the bag, and was removed in pieces from the bag from the left port site. The pelvis was copiously irrigated, however.  The operative site was surveyed, and it was found to be hemostatic.  No intraoperative injury to surrounding organs was noted.   Pictures were taken of the quadrants and pelvis. The abdomen was desufflated and all instruments were then removed from the patient's abdomen. The uterine manipulator was removed without complications.  All incisions were closed with 4-0 Vicryl and Dermabond.   The patient tolerated the procedures well.  All instruments, needles, and sponge counts were correct x 2. The patient was taken to the recovery  room in stable condition.

## 2017-12-01 NOTE — Discharge Instructions (Signed)
Please call the office for a postop appointment in 2 weeks. When you call, ask them to order a Nexplanon through your insurance, and we can place it at your postop visit.  Places online for information about contraception/birth control hormonal options: - [email or web address]org - http://www.ramirez.info/ (frequently asked questions answered in medical accurate ways by the [email or web address] (all sorts of data that might be interesting to you)    Laparoscopic Ovarian Surgery Discharge Instructions  For the next three days, take ibuprofen and acetaminophen on a schedule, every 8 hours. You can take them together or you can intersperse them, and take one every four hours. I also gave you gabapentin for nighttime, to help you sleep and also to control pain. Take gabapentin medicines at night for at least the next 3 nights. You also have a narcotic, oxycodone, to take as needed if the above medicines don't help.  Postop constipation is a major cause of pain. Stay well hydrated, walk as you tolerate, and take over the counter senna as well as stool softeners if you need them.   RISKS AND COMPLICATIONS   Infection.  Bleeding.  Injury to surrounding organs.  Anesthetic side effects.   PROCEDURE   You may be given a medicine to help you relax (sedative) before the procedure. You will be given a medicine to make you sleep (general anesthetic) during the procedure.  A tube will be put down your throat to help your breath while under general anesthesia.  Several small cuts (incisions) are made in the lower abdominal area and one incision is made near the belly button.  Your abdominal area will be inflated with a safe gas (carbon dioxide). This helps give the surgeon room to operate, visualize, and helps the surgeon avoid other organs.  A thin, lighted tube (laparoscope) with a camera attached is inserted into your abdomen through the incision near the belly button. Other  small instruments may also be inserted through other abdominal incisions.  The ovary is located and are removed.  After the ovary is removed, the gas is released from the abdomen.  The incisions will be closed with stitches (sutures), and Dermabond. A bandage may be placed over the incisions.  AFTER THE PROCEDURE   You will also have some mild abdominal discomfort for 3-7 days. You will be given pain medicine to ease any discomfort.  As long as there are no problems, you may be allowed to go home. Someone will need to drive you home and be with you for at least 24 hours once home.  You may have some mild discomfort in the throat. This is from the tube placed in your throat while you were sleeping.  You may experience discomfort in the shoulder area from some trapped air between the liver and diaphragm. This sensation is normal and will slowly go away on its own.  HOME CARE INSTRUCTIONS   Take all medicines as directed.  Only take over-the-counter or prescription medicines for pain, discomfort, or fever as directed by your caregiver.  Resume daily activities as directed.  Showers are preferred over baths for 2 weeks.  You may resume sexual activities in 1 week or as you feel you would like to.  Do not drive while taking narcotics.  SEEK MEDICAL CARE IF: .  There is increasing abdominal pain.  You feel lightheaded or faint.  You have the chills.  You have an oral temperature above 102 F (38.9 C).  There is pus-like (purulent)  drainage from any of the wounds.  You are unable to pass gas or have a bowel movement.  You feel sick to your stomach (nauseous) or throw up (vomit) and can't control it with your medicines.  MAKE SURE YOU:   Understand these instructions.  Will watch your condition.  Will get help right away if you are not doing well or get worse.  [employer] Patient Information [address] [facility] [employer].

## 2017-12-01 NOTE — Anesthesia Procedure Notes (Addendum)
Procedure Name: Intubation Date/Time: 12/01/2017 10:50 PM Performed by: Allean Found, CRNA Pre-anesthesia Checklist: Patient identified, Patient being monitored, Timeout performed, Emergency Drugs available and Suction available Patient Re-evaluated:Patient Re-evaluated prior to induction Oxygen Delivery Method: Circle system utilized Preoxygenation: Pre-oxygenation with 100% oxygen Induction Type: IV induction Ventilation: Mask ventilation without difficulty Laryngoscope Size: Mac and 3 Grade View: Grade I Tube type: Oral Tube size: 7.0 mm Number of attempts: 1 Airway Equipment and Method: Stylet Placement Confirmation: ETT inserted through vocal cords under direct vision,  positive ETCO2 and breath sounds checked- equal and bilateral Secured at: 21 cm Tube secured with: Tape Dental Injury: Teeth and Oropharynx as per pre-operative assessment

## 2017-12-03 ENCOUNTER — Encounter: Payer: Self-pay | Admitting: Obstetrics and Gynecology

## 2017-12-04 NOTE — Anesthesia Postprocedure Evaluation (Signed)
Anesthesia Post Note  Patient: Theresa Christian  Procedure(s) Performed: LAPAROSCOPY DIAGNOSTIC, RIGHT OVARIAN CYSTECTOMY (Right Abdomen)  Patient location during evaluation: PACU Anesthesia Type: General Level of consciousness: awake and alert Pain management: pain level controlled Vital Signs Assessment: post-procedure vital signs reviewed and stable Respiratory status: spontaneous breathing, nonlabored ventilation and respiratory function stable Cardiovascular status: blood pressure returned to baseline and stable Postop Assessment: no apparent nausea or vomiting Anesthetic complications: no     Last Vitals:  Vitals:   12/01/17 0300 12/01/17 0826  BP: 122/86 127/82  Pulse: 96 97  Resp: 20 18  Temp: 37 C 36.7 C  SpO2: 98% 97%    Last Pain:  Vitals:   12/01/17 0930  TempSrc:   PainSc: 0-No pain                 Durenda Hurt

## 2017-12-06 LAB — SURGICAL PATHOLOGY

## 2018-10-02 IMAGING — CT CT ABD-PELV W/ CM
2 of 4 series · 16 of 46 positions shown, 18 images · IV contrast (APPLIED)
Comparison: Pelvic ultrasound 03/15/2016

CLINICAL DATA: Right lower abdominal pain

EXAM:
CT ABDOMEN AND PELVIS WITH CONTRAST
TECHNIQUE: Multidetector CT imaging of the abdomen and pelvis was performed
using the standard protocol following bolus administration of
intravenous contrast.
CONTRAST:  100mL [id number] IOPAMIDOL ([id number]) INJECTION 61%

[Series 2: routine abd/pel with · axial · 0.71mm/px · z∈[-330,+85]mm · 13 of 91 slices shown, 15 images]
[im 4/91  soft-tissue]
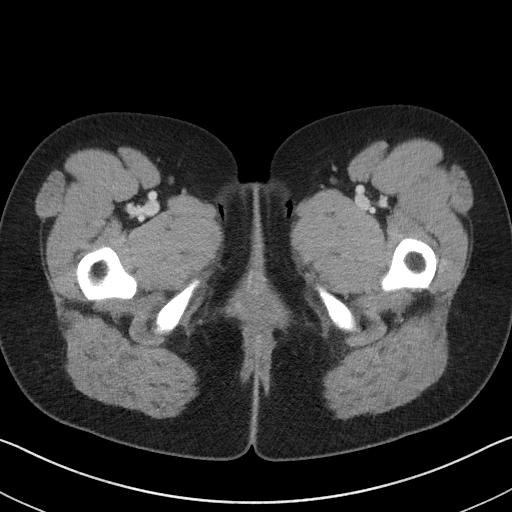
[im 4/91  bone]
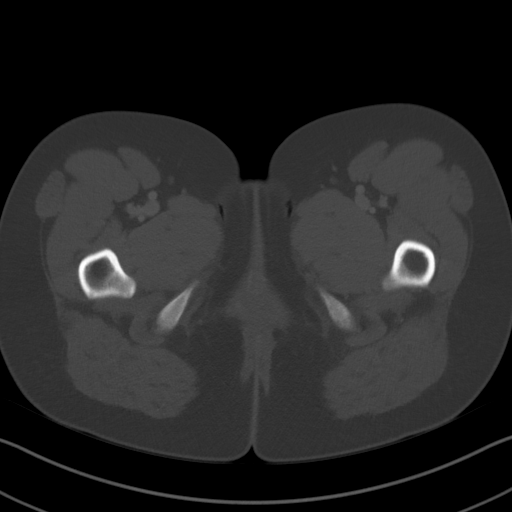
[im 11/91  soft-tissue]
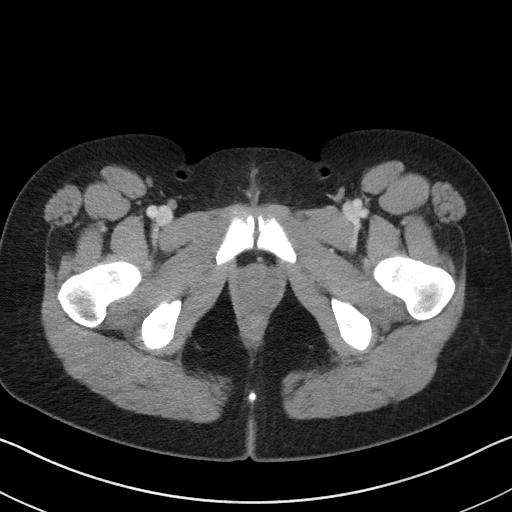
[im 19/91  soft-tissue]
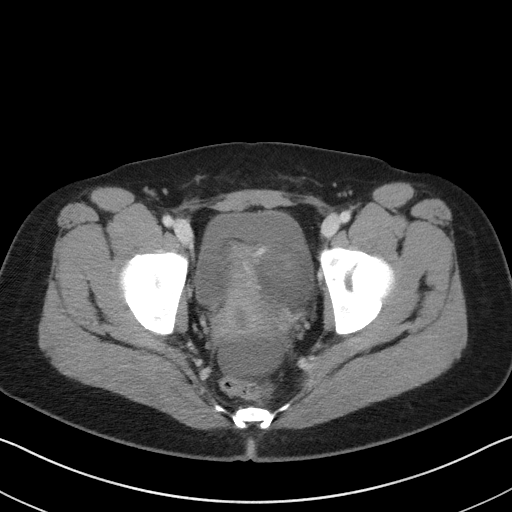
[im 26/91  soft-tissue]
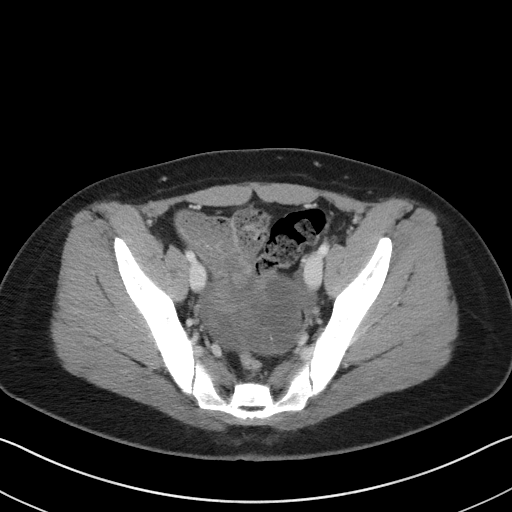
[im 33/91  soft-tissue]
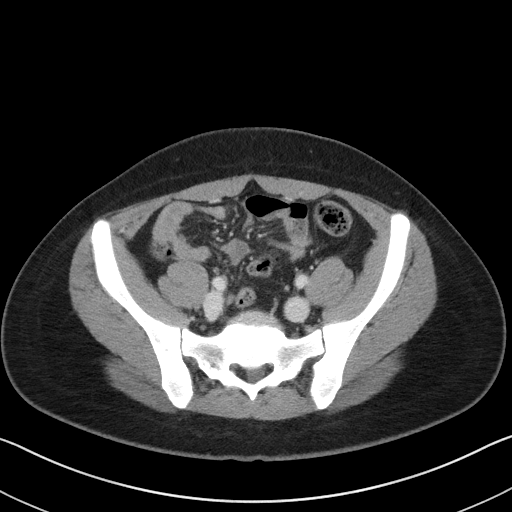
[im 40/91  soft-tissue]
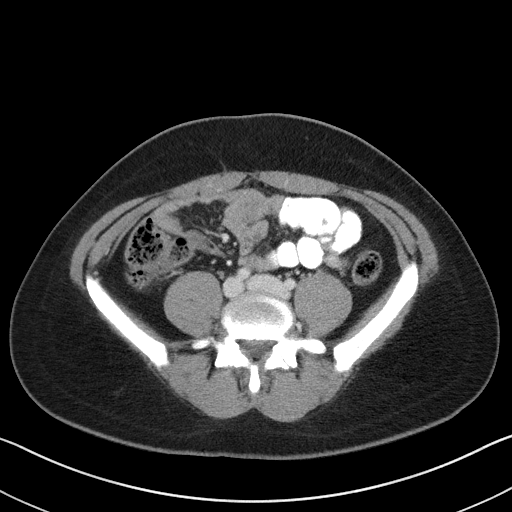
[im 47/91  soft-tissue]
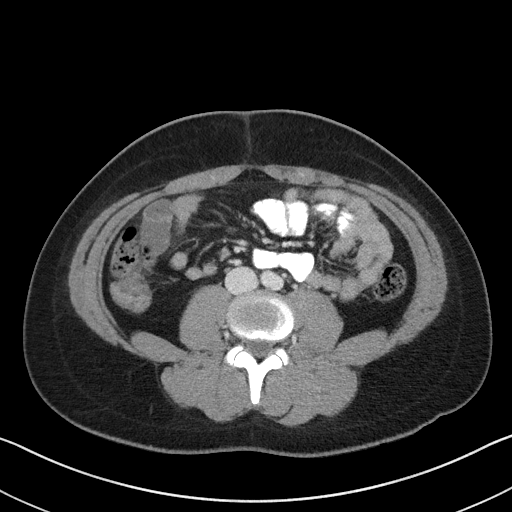
[im 51/91  soft-tissue]
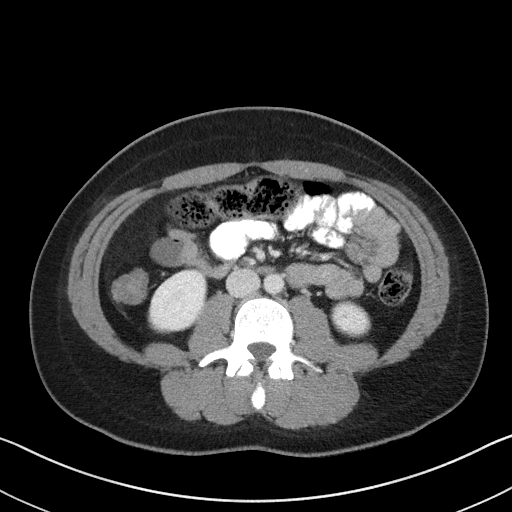
[im 58/91  soft-tissue]
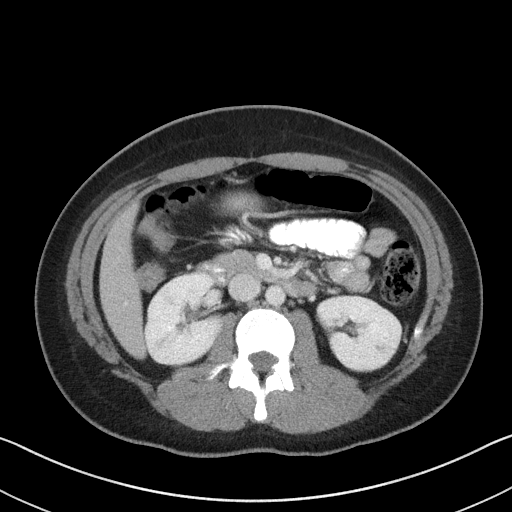
[im 58/91  bone]
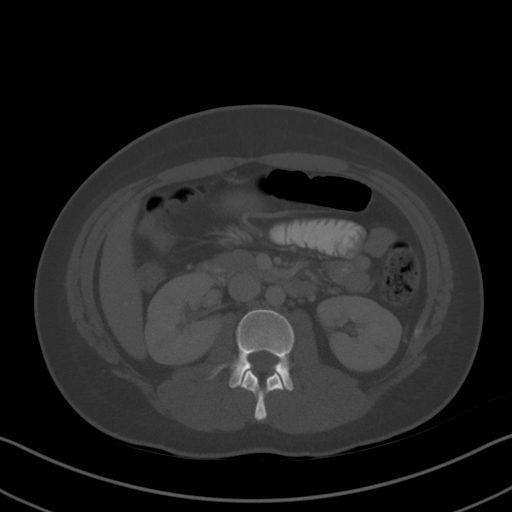
[im 65/91  soft-tissue]
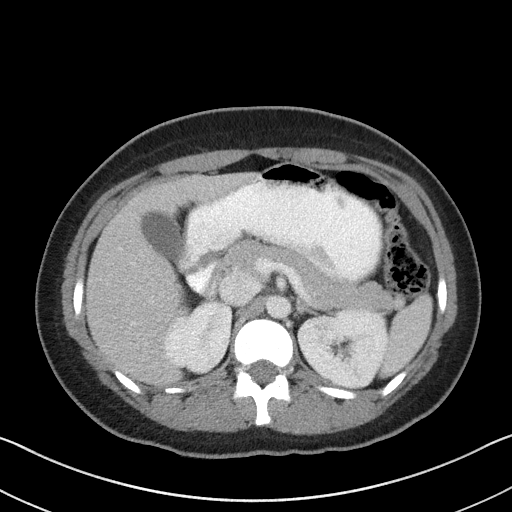
[im 73/91  soft-tissue]
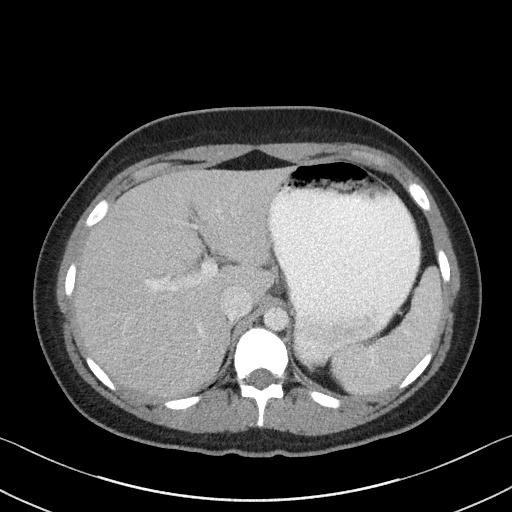
[im 80/91  soft-tissue]
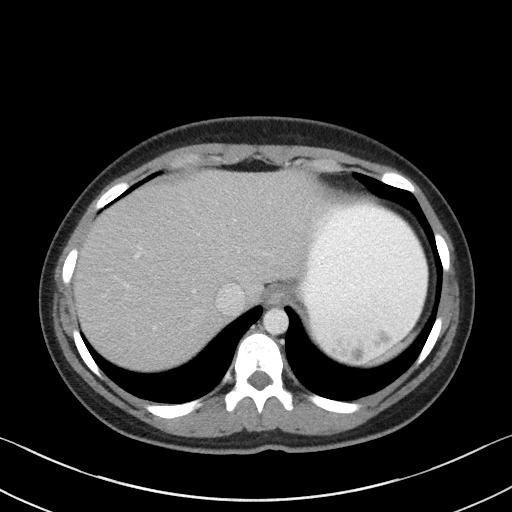
[im 87/91  soft-tissue]
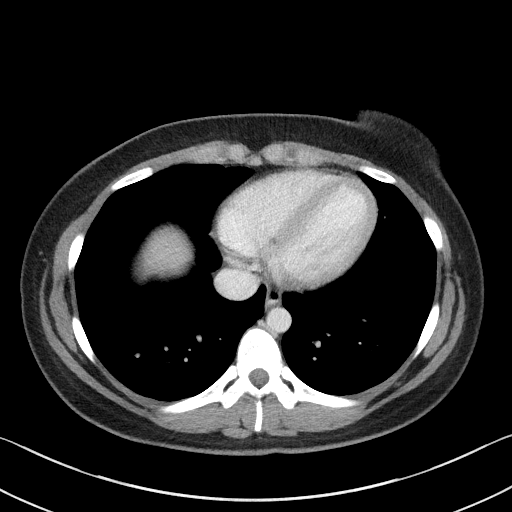

[Series 5: coronal st · coronal · 0.68mm/px · 3 of 79 slices shown]
[im 27/79  soft-tissue]
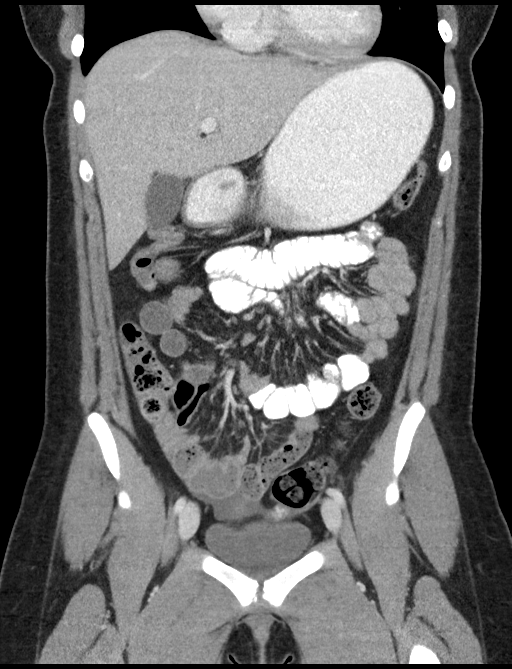
[im 35/79  soft-tissue]
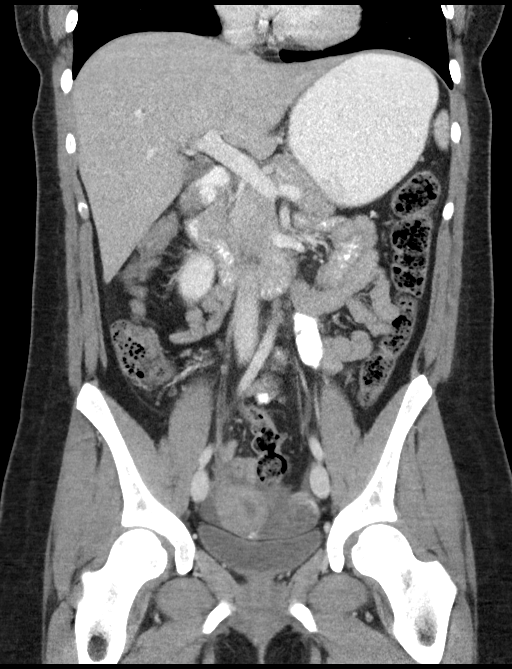
[im 44/79  soft-tissue]
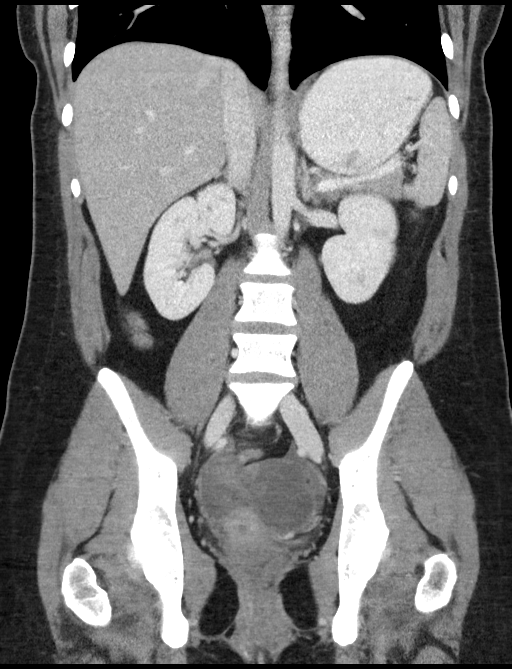

[16 of 46 positions shown; findings below may reference images not displayed]

FINDINGS: Lower chest: No acute abnormality.

Hepatobiliary: No focal liver abnormality is seen. No gallstones,
gallbladder wall thickening, or biliary dilatation.

Pancreas: Unremarkable. No pancreatic ductal dilatation or
surrounding inflammatory changes.

Spleen: Normal in size without focal abnormality.

Adrenals/Urinary Tract: Adrenal glands are unremarkable. Kidneys are
normal, without renal calculi, focal lesion, or hydronephrosis.
Bladder is unremarkable.

Stomach/Bowel: Stomach is within normal limits. Appendix appears
normal. No evidence of bowel wall thickening, distention, or
inflammatory changes.

Vascular/Lymphatic: No significant vascular findings are present. No
enlarged abdominal or pelvic lymph nodes.

Reproductive: 5.2 cm cyst in the left adnexa.  Uterus unremarkable.

Other: Small free fluid in the pelvis.  No free air.

Musculoskeletal: No acute or significant osseous findings.
IMPRESSION: 1. No CT evidence for acute appendicitis.
2. Small free fluid in the pelvis. 5.2 cm left ovarian cyst. Please
see prior pelvic ultrasound.

## 2020-06-18 IMAGING — CT CT RENAL STONE PROTOCOL
3 of 4 series · 8 of 46 positions shown, 15 images · non-contrast
Comparison: CT abdomen and pelvis 03/15/2016. Pelvic ultrasound
03/15/2016.

CLINICAL DATA: Acute onset right lower quadrant pain 2-3 hours ago.

EXAM:
CT ABDOMEN AND PELVIS WITHOUT CONTRAST
TECHNIQUE: Multidetector CT imaging of the abdomen and pelvis was performed
following the standard protocol without IV contrast.

[Series 4: lung bases · axial · 0.85mm/px · z∈[-192,-132]mm · 4 of 21 slices shown, 9 images]
[im 5/21  soft-tissue]
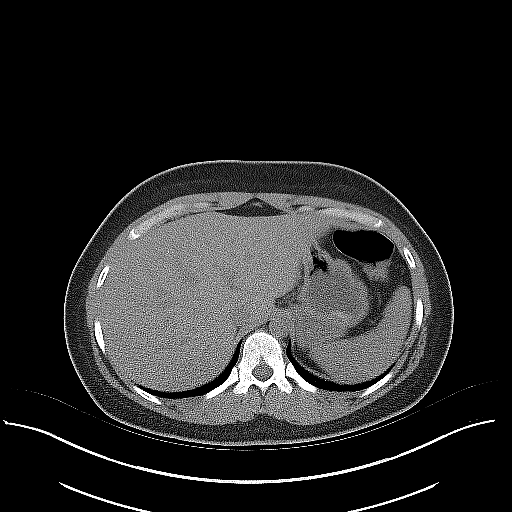
[im 5/21  lung]
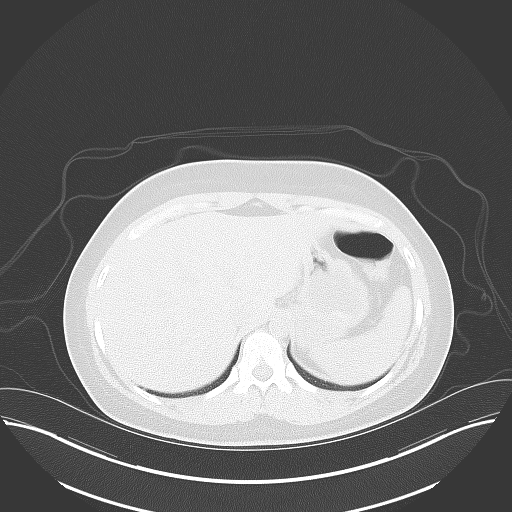
[im 5/21  bone]
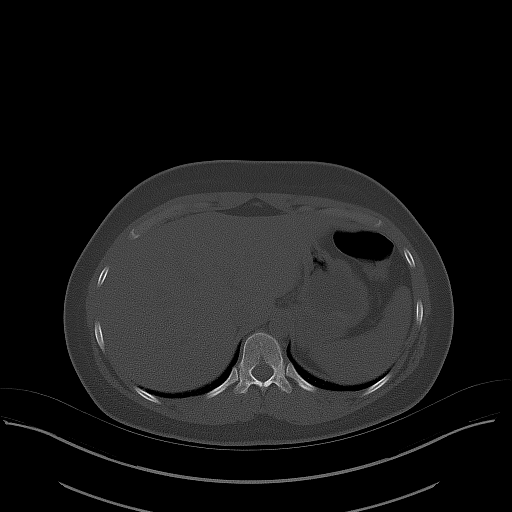
[im 9/21  soft-tissue]
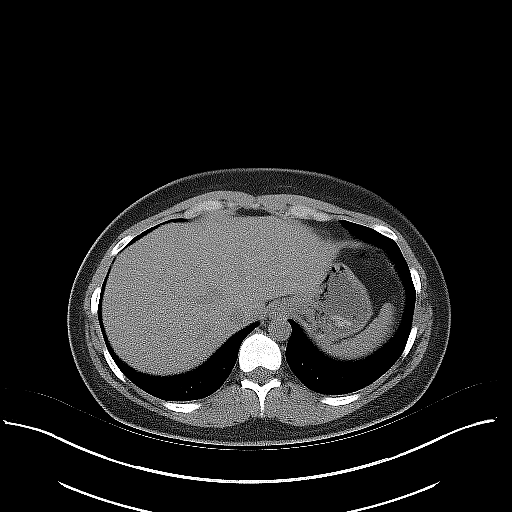
[im 9/21  lung]
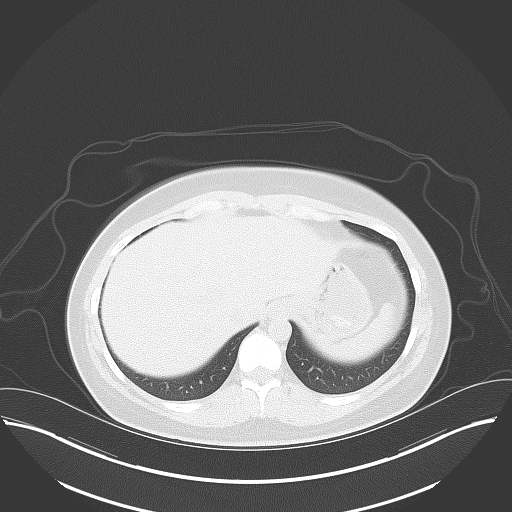
[im 13/21  soft-tissue]
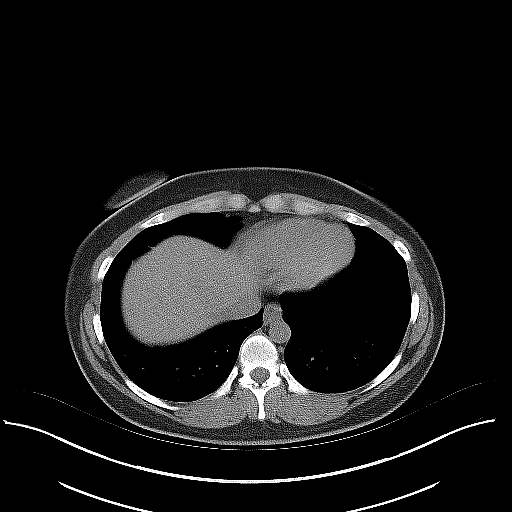
[im 13/21  lung]
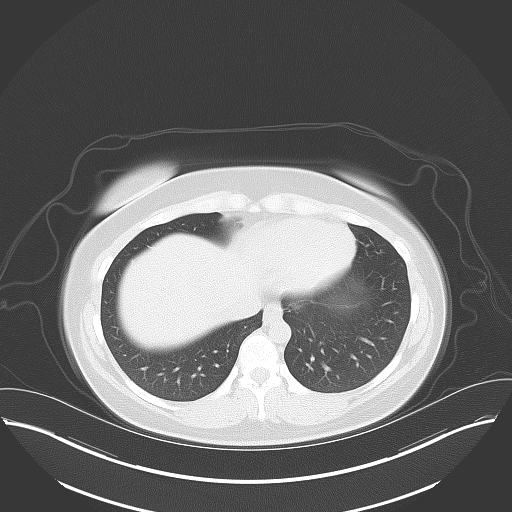
[im 17/21  soft-tissue]
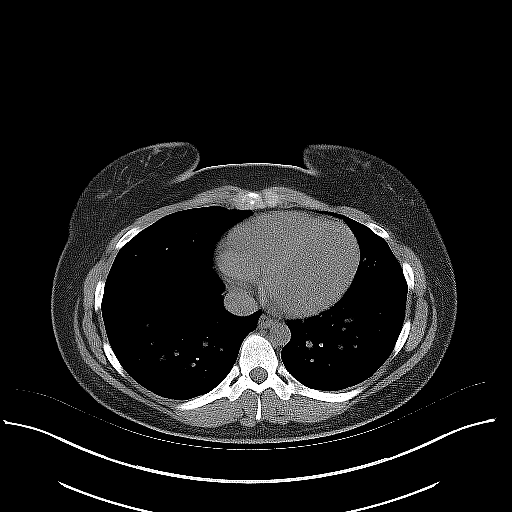
[im 17/21  lung]
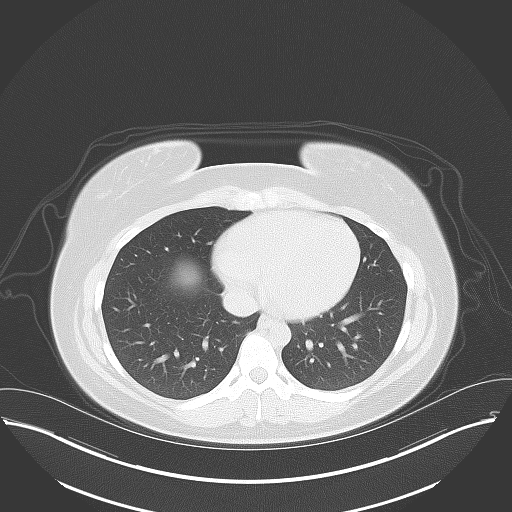

[Series 5: coronal · coronal · 0.78mm/px · 3 of 121 slices shown, 4 images]
[im 41/121  soft-tissue]
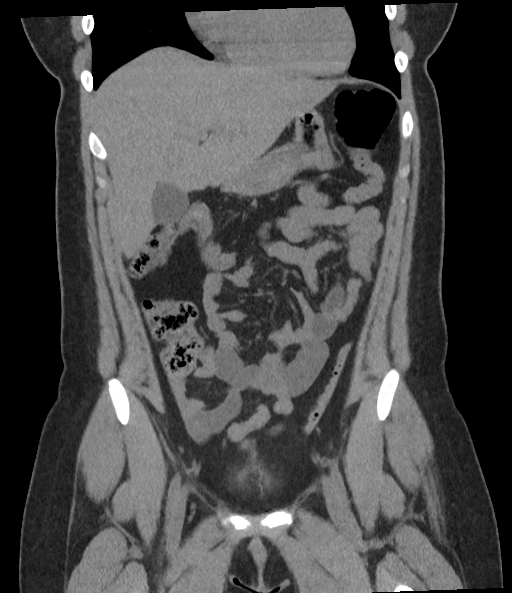
[im 54/121  soft-tissue]
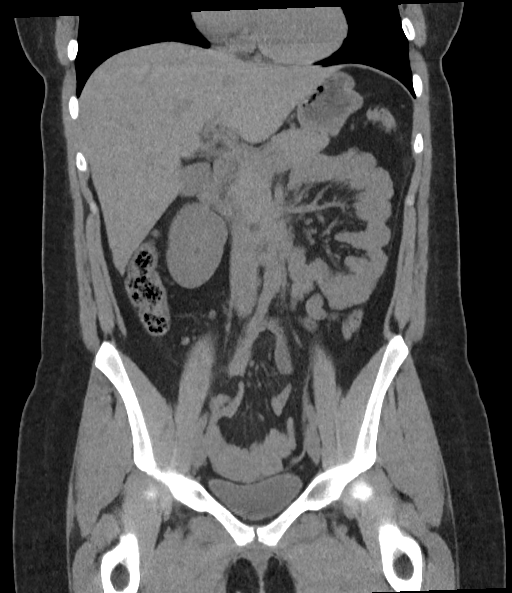
[im 54/121  bone]
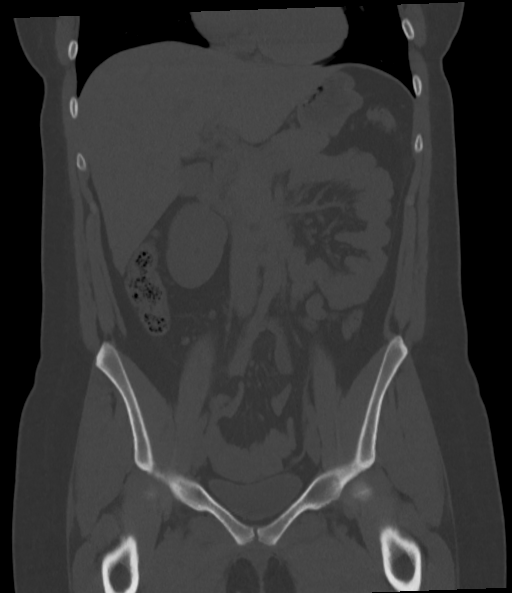
[im 67/121  soft-tissue]
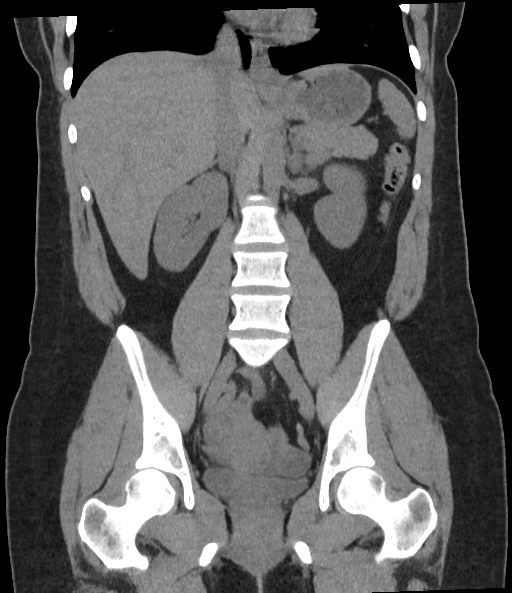

[Series 6: sagittal · sagittal · 0.53mm/px · 1 of 173 slices shown, 2 images]
[im 58/173  soft-tissue]
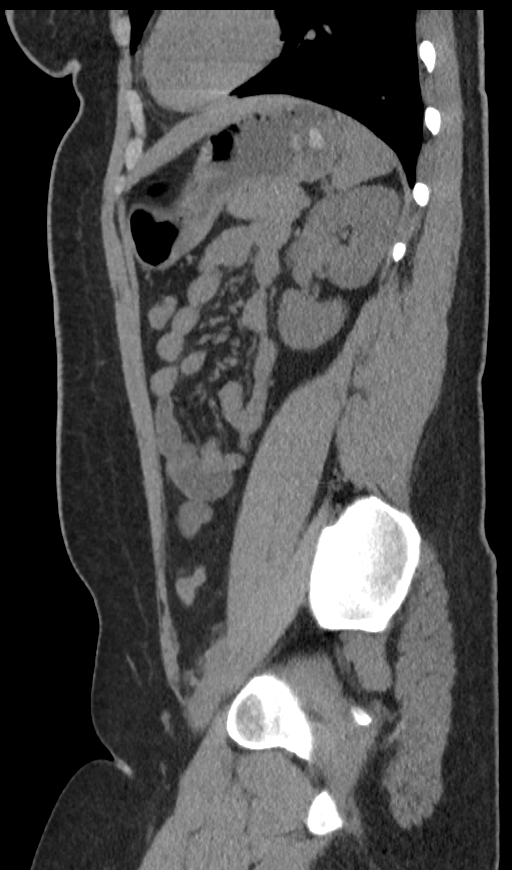
[im 58/173  bone]
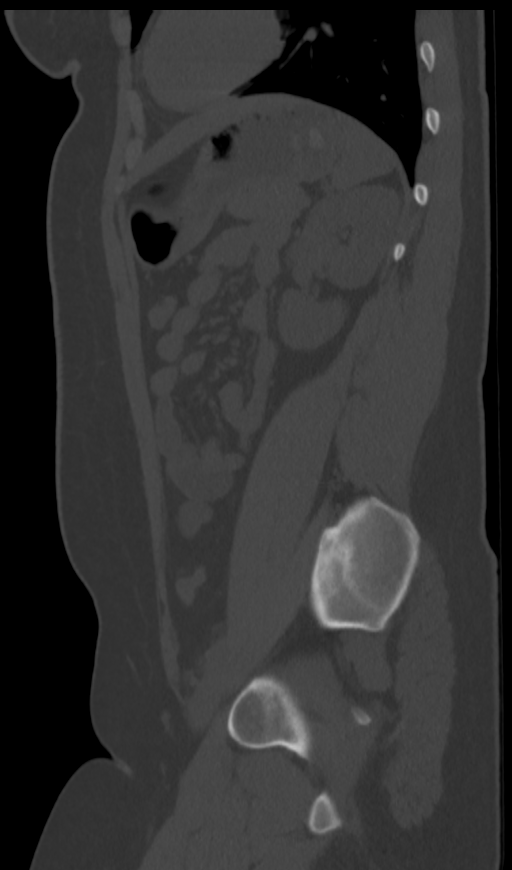

[8 of 46 positions shown; findings below may reference images not displayed]

FINDINGS: Lower chest: Lung bases are clear. No pleural or pericardial
effusion.

Hepatobiliary: No focal liver abnormality is seen. No gallstones,
gallbladder wall thickening, or biliary dilatation.

Pancreas: Unremarkable. No pancreatic ductal dilatation or
surrounding inflammatory changes.

Spleen: Normal in size without focal abnormality.

Adrenals/Urinary Tract: Adrenal glands are unremarkable. Kidneys are
normal, without renal calculi, focal lesion, or hydronephrosis.
Bladder is unremarkable.

Stomach/Bowel: Stomach is within normal limits. Appendix appears
normal. No evidence of bowel wall thickening, distention, or
inflammatory changes.

Vascular/Lymphatic: No significant vascular findings are present. No
enlarged abdominal or pelvic lymph nodes.

Reproductive: A left ovarian lesion measuring 5.9 cm transverse by
4.1 cm AP by 4.6 cm craniocaudal is predominantly fluid attenuating
but does have calcium and fat within it consistent with a teratoma.
The lesion was present on the prior examination but the fat and
calcium components within are much more conspicuous on today's
study. The lesion is not grossly changed in size.

Other: None.

Musculoskeletal: No acute or focal abnormality.
IMPRESSION: Negative for appendicitis.  No acute abnormality.

Left ovarian teratoma.

## 2020-09-21 IMAGING — US US ART/VEN ABD/PELV/SCROTUM DOPPLER LTD
1 series · 13 of 25 positions shown · non-contrast
Comparison: 11/30/2017 CT abdomen and pelvis. 03/15/2016 pelvic
ultrasound.

CLINICAL DATA: 22 y/o  F; 2 hours of pelvic pain.

EXAM:
TRANSABDOMINAL AND TRANSVAGINAL ULTRASOUND OF PELVIS
DOPPLER ULTRASOUND OF OVARIES
TECHNIQUE: Both transabdominal and transvaginal ultrasound examinations of the
pelvis were performed. Transabdominal technique was performed for
global imaging of the pelvis including uterus, ovaries, adnexal
regions, and pelvic cul-de-sac.
It was necessary to proceed with endovaginal exam following the
transabdominal exam to visualize the adnexa. Color and duplex
Doppler ultrasound was utilized to evaluate blood flow to the
ovaries.

[Series 1: us art/ven abd/pelv/scrotum doppler ltd · 0.22mm/px · 59 acquisitions, 13 frames shown]
[im 1/59]
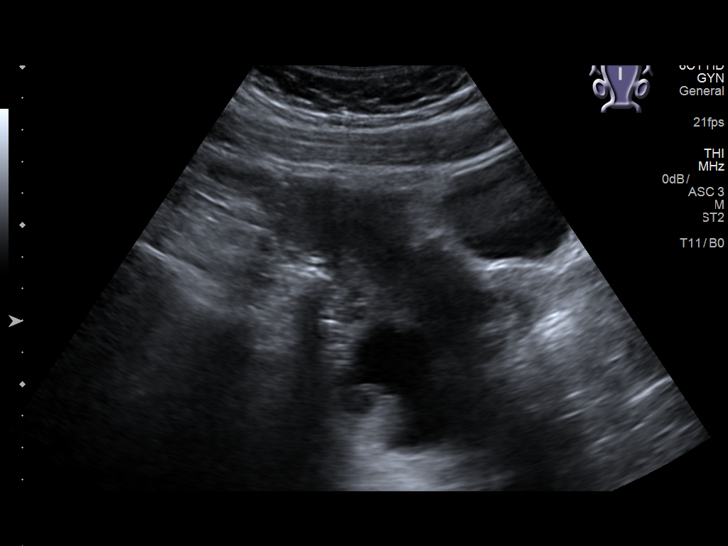
[im 5/59]
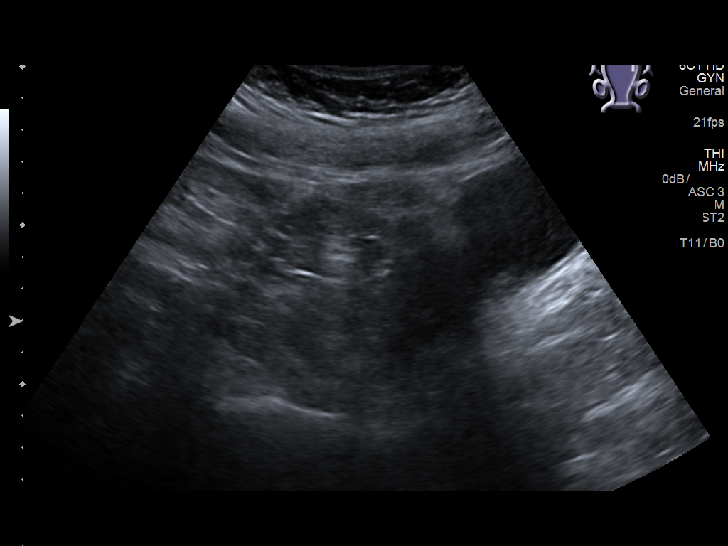
[im 10/59]
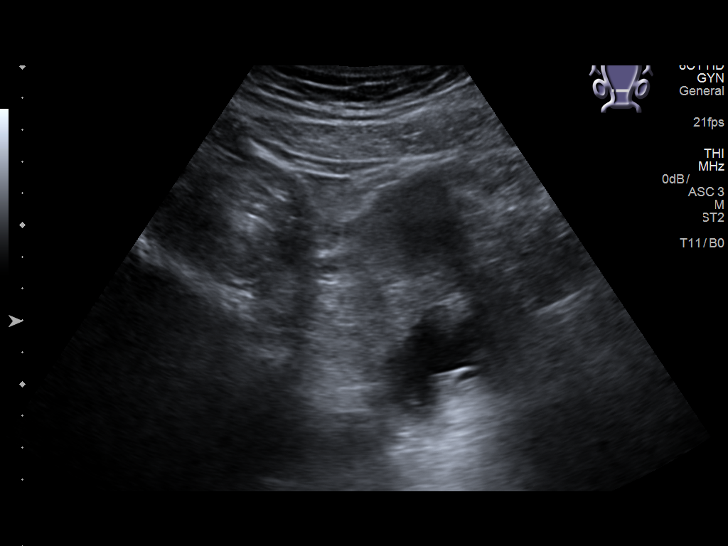
[im 15/59]
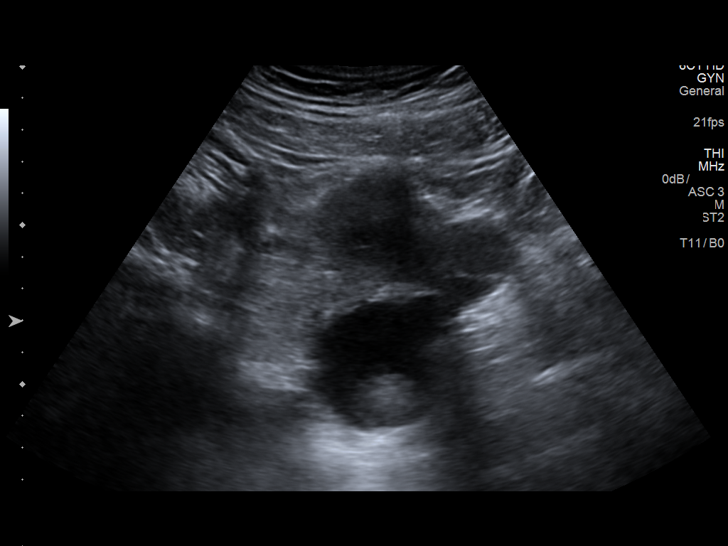
[im 20/59]
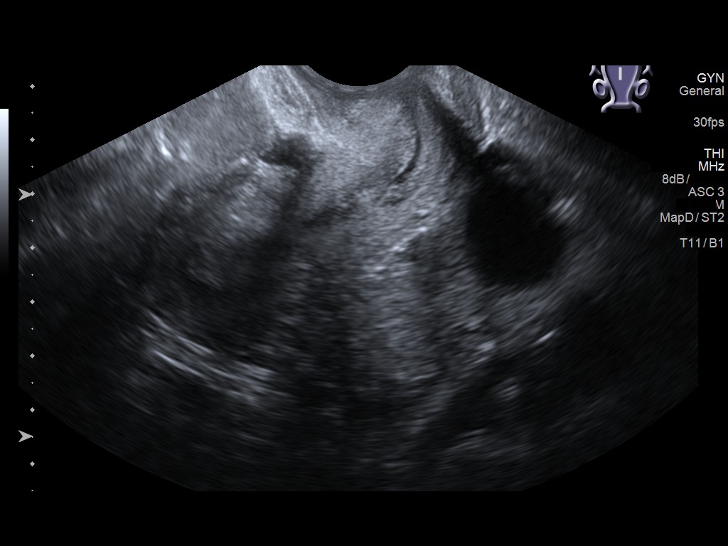
[im 25/59]
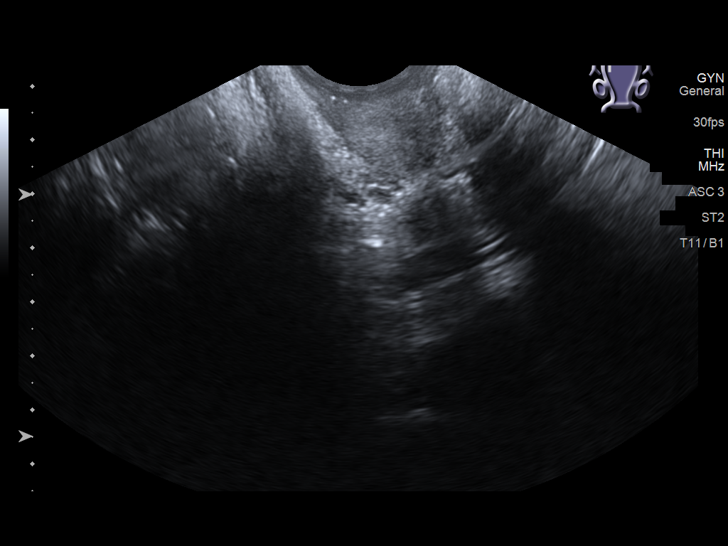
[im 30/59]
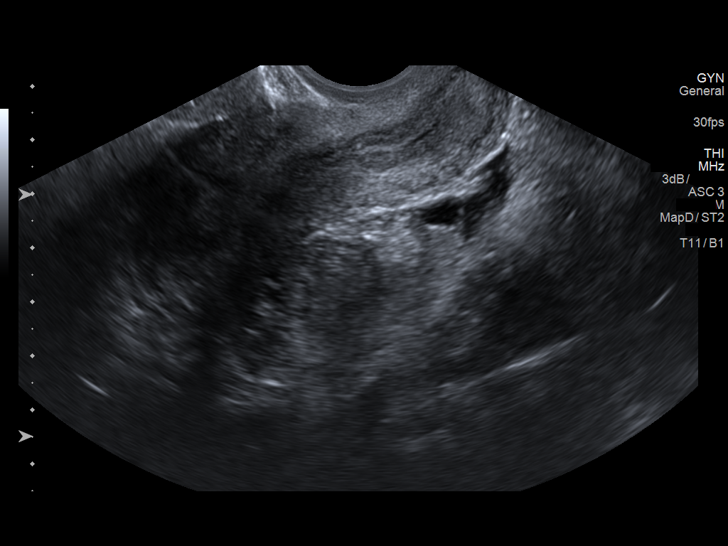
[im 34/59]
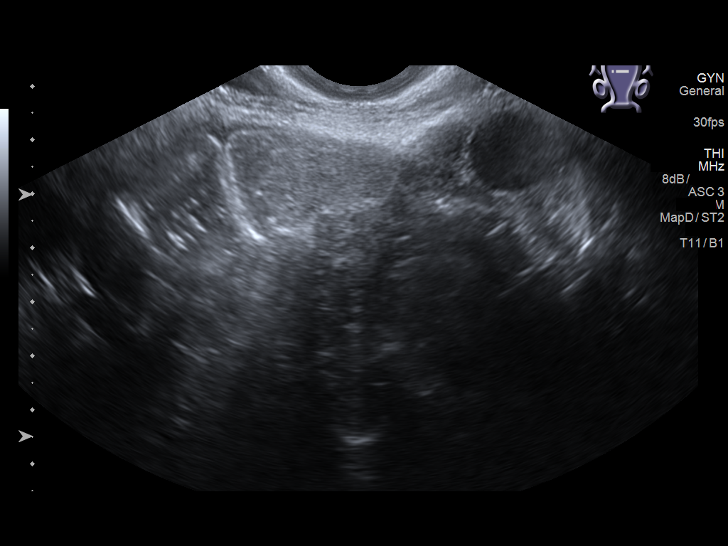
[im 39/59]
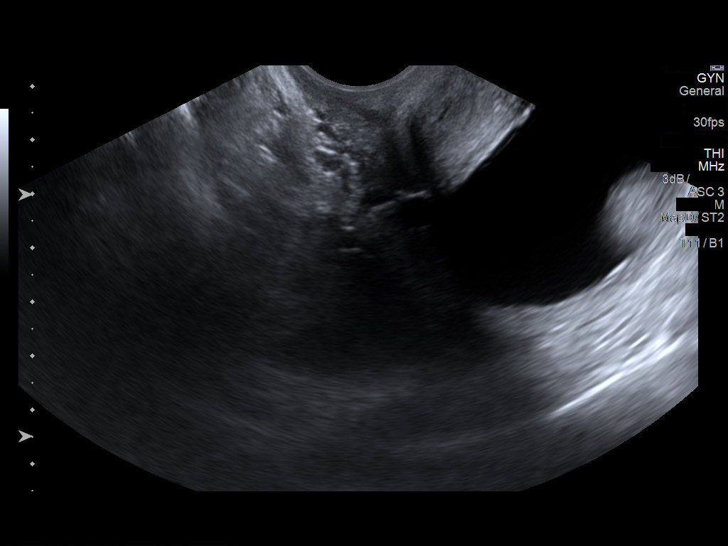
[im 44/59]
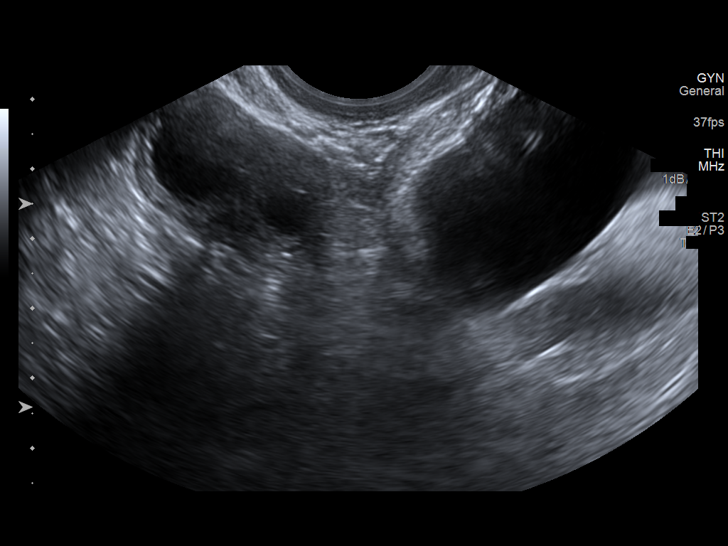
[im 49/59]
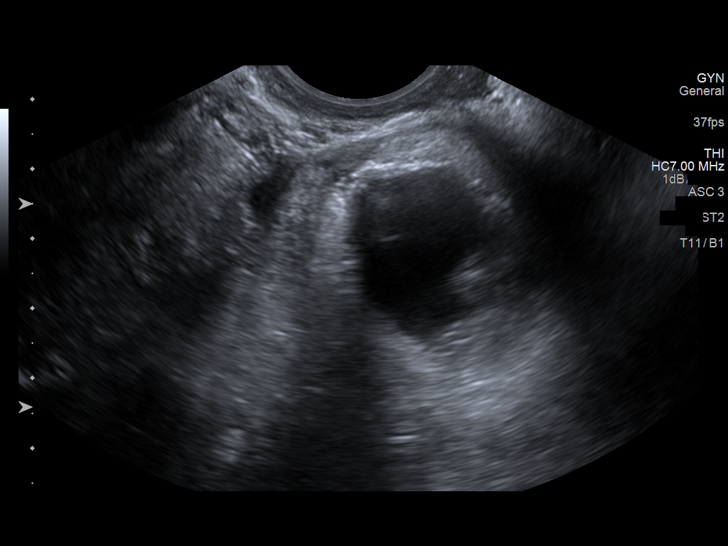
[im 54/59]
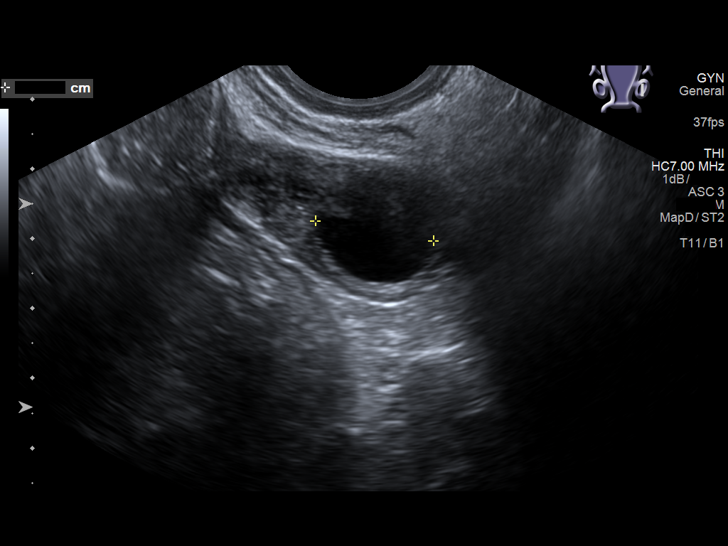
[im 59/59]
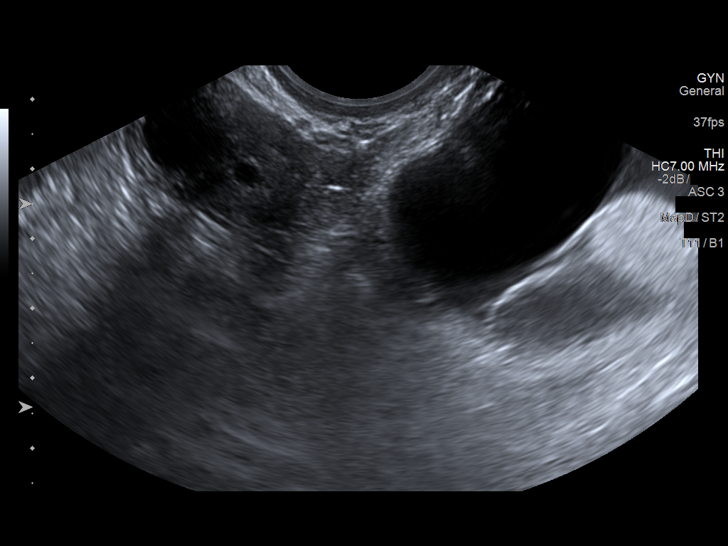

[13 of 25 positions shown; findings below may reference images not displayed]

FINDINGS: Uterus

Measurements: 7.6 x 3.2 x 3.8 cm = volume: 48 mL. No fibroids or
other mass visualized.

Endometrium

Thickness: 11 mm.  No focal abnormality visualized.

Right ovary

Not visualized.

Left ovary

Measurements: 3.2 x 1.9 x 3.2 cm = volume: 9.9 mL. Normal appearance
of the ovary with simple follicles. Adjacent to the ovary in the
left adnexa is a complex mass measuring 5.7 x 4.5 x 6.0 cm with both
cystic and solid components. On CT the solid components demonstrate
calcium and fat attenuation.

Pulsed Doppler evaluation of both ovaries demonstrates normal
low-resistance arterial and venous waveforms.

Other findings

No abnormal free fluid.
IMPRESSION: 1. No acute process identified.
2. Left adnexal 6 cm mass with features on CT and ultrasound
consistent with teratoma.
